# Patient Record
Sex: Female | Born: 1960 | Race: Black or African American | Hispanic: No | Marital: Single | Smoking: Current every day smoker
Health system: Southern US, Community
[De-identification: ages and names within clinical notes are randomized; demographics above are authoritative.]

## PROBLEM LIST (undated history)

## (undated) DIAGNOSIS — I69398 Other sequelae of cerebral infarction: Secondary | ICD-10-CM

## (undated) DIAGNOSIS — Z992 Dependence on renal dialysis: Secondary | ICD-10-CM

## (undated) DIAGNOSIS — R569 Unspecified convulsions: Secondary | ICD-10-CM

## (undated) DIAGNOSIS — N186 End stage renal disease: Secondary | ICD-10-CM

## (undated) DIAGNOSIS — R269 Unspecified abnormalities of gait and mobility: Secondary | ICD-10-CM

## (undated) DIAGNOSIS — I10 Essential (primary) hypertension: Secondary | ICD-10-CM

## (undated) DIAGNOSIS — I669 Occlusion and stenosis of unspecified cerebral artery: Secondary | ICD-10-CM

## (undated) DIAGNOSIS — Z794 Long term (current) use of insulin: Secondary | ICD-10-CM

## (undated) DIAGNOSIS — E119 Type 2 diabetes mellitus without complications: Secondary | ICD-10-CM

## (undated) DIAGNOSIS — Z8673 Personal history of transient ischemic attack (TIA), and cerebral infarction without residual deficits: Secondary | ICD-10-CM

## (undated) HISTORY — PX: AV FISTULA PLACEMENT: SHX1204

## (undated) HISTORY — PX: BURR HOLE: SHX908

---

## 2015-05-31 ENCOUNTER — Emergency Department (HOSPITAL_COMMUNITY): Payer: Medicaid Other

## 2015-05-31 ENCOUNTER — Non-Acute Institutional Stay (HOSPITAL_COMMUNITY)
Admission: EM | Admit: 2015-05-31 | Discharge: 2015-06-01 | Disposition: A | Discharge status: Home / Self Care | Payer: Medicaid Other | Attending: Emergency Medicine | Admitting: Emergency Medicine

## 2015-05-31 ENCOUNTER — Encounter (HOSPITAL_COMMUNITY): Payer: Self-pay | Admitting: *Deleted

## 2015-05-31 DIAGNOSIS — F17210 Nicotine dependence, cigarettes, uncomplicated: Secondary | ICD-10-CM | POA: Diagnosis not present

## 2015-05-31 DIAGNOSIS — F79 Unspecified intellectual disabilities: Secondary | ICD-10-CM | POA: Insufficient documentation

## 2015-05-31 DIAGNOSIS — N186 End stage renal disease: Secondary | ICD-10-CM | POA: Diagnosis present

## 2015-05-31 DIAGNOSIS — D631 Anemia in chronic kidney disease: Secondary | ICD-10-CM

## 2015-05-31 DIAGNOSIS — E875 Hyperkalemia: Secondary | ICD-10-CM | POA: Diagnosis not present

## 2015-05-31 DIAGNOSIS — E8779 Other fluid overload: Secondary | ICD-10-CM

## 2015-05-31 DIAGNOSIS — N189 Chronic kidney disease, unspecified: Secondary | ICD-10-CM

## 2015-05-31 DIAGNOSIS — Z8673 Personal history of transient ischemic attack (TIA), and cerebral infarction without residual deficits: Secondary | ICD-10-CM | POA: Insufficient documentation

## 2015-05-31 DIAGNOSIS — Z992 Dependence on renal dialysis: Secondary | ICD-10-CM | POA: Diagnosis not present

## 2015-05-31 HISTORY — DX: Unspecified convulsions: R56.9

## 2015-05-31 HISTORY — DX: Occlusion and stenosis of unspecified cerebral artery: I66.9

## 2015-05-31 LAB — BASIC METABOLIC PANEL
Anion gap: 25 — ABNORMAL HIGH (ref 5–15)
BUN: 112 mg/dL — ABNORMAL HIGH (ref 6–20)
CALCIUM: 8.5 mg/dL — AB (ref 8.9–10.3)
CO2: 18 mmol/L — ABNORMAL LOW (ref 22–32)
CREATININE: 17.54 mg/dL — AB (ref 0.44–1.00)
Chloride: 99 mmol/L — ABNORMAL LOW (ref 101–111)
GFR calc non Af Amer: 2 mL/min — ABNORMAL LOW (ref >60)
GFR, EST AFRICAN AMERICAN: 2 mL/min — AB (ref >60)
Glucose, Bld: 139 mg/dL — ABNORMAL HIGH (ref 65–99)
Potassium: 6.3 mmol/L (ref 3.5–5.1)
SODIUM: 142 mmol/L (ref 135–145)

## 2015-05-31 LAB — CBC WITH DIFFERENTIAL/PLATELET
BASOS PCT: 0 %
Basophils Absolute: 0 10*3/uL (ref 0.0–0.1)
Eosinophils Absolute: 0.2 10*3/uL (ref 0.0–0.7)
Eosinophils Relative: 4 %
HCT: 33.6 % — ABNORMAL LOW (ref 36.0–46.0)
HEMOGLOBIN: 10.8 g/dL — AB (ref 12.0–15.0)
Lymphocytes Relative: 32 %
Lymphs Abs: 1.8 10*3/uL (ref 0.7–4.0)
MCH: 27 pg (ref 26.0–34.0)
MCHC: 32.1 g/dL (ref 30.0–36.0)
MCV: 84 fL (ref 78.0–100.0)
MONOS PCT: 14 %
Monocytes Absolute: 0.8 10*3/uL (ref 0.1–1.0)
NEUTROS PCT: 50 %
Neutro Abs: 2.9 10*3/uL (ref 1.7–7.7)
PLATELETS: 146 10*3/uL — AB (ref 150–400)
RBC: 4 MIL/uL (ref 3.87–5.11)
RDW: 14.3 % (ref 11.5–15.5)
WBC: 5.7 10*3/uL (ref 4.0–10.5)

## 2015-05-31 MED ORDER — SODIUM POLYSTYRENE SULFONATE 15 GM/60ML PO SUSP
30.0000 g | Freq: Once | ORAL | Status: AC
  Administered 2015-05-31: 30 g via ORAL
  Filled 2015-05-31: qty 120

## 2015-05-31 NOTE — ED Notes (Signed)
CBC reordered for lab due to conflicting results

## 2015-05-31 NOTE — ED Notes (Signed)
Lab called with critical potassium 6.3
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
## 2015-06-01 DIAGNOSIS — E875 Hyperkalemia: Secondary | ICD-10-CM | POA: Diagnosis present

## 2015-06-01 NOTE — Progress Notes (Signed)
Dialysis treatment completed this a.m.[name] Pt tolerated HD well. Net UF removed: 2800 ml. Discharged (A/Ox3-4, coherent, and conversant) per wheelchair to sister 62.
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
## 2015-06-02 LAB — HEPATITIS B SURFACE ANTIGEN: HEP B S AG: NEGATIVE

## 2017-03-27 IMAGING — CR DG CHEST 1V PORT
1 series · 1 of 1 positions shown · non-contrast
Comparison: None.

CLINICAL DATA: 54-year-old with end-stage renal disease on
hemodialysis who has not been dialyzed for approximately 10 days,
presenting with acute shortness of breath.

EXAM:
PORTABLE CHEST 1 VIEW

[AP]
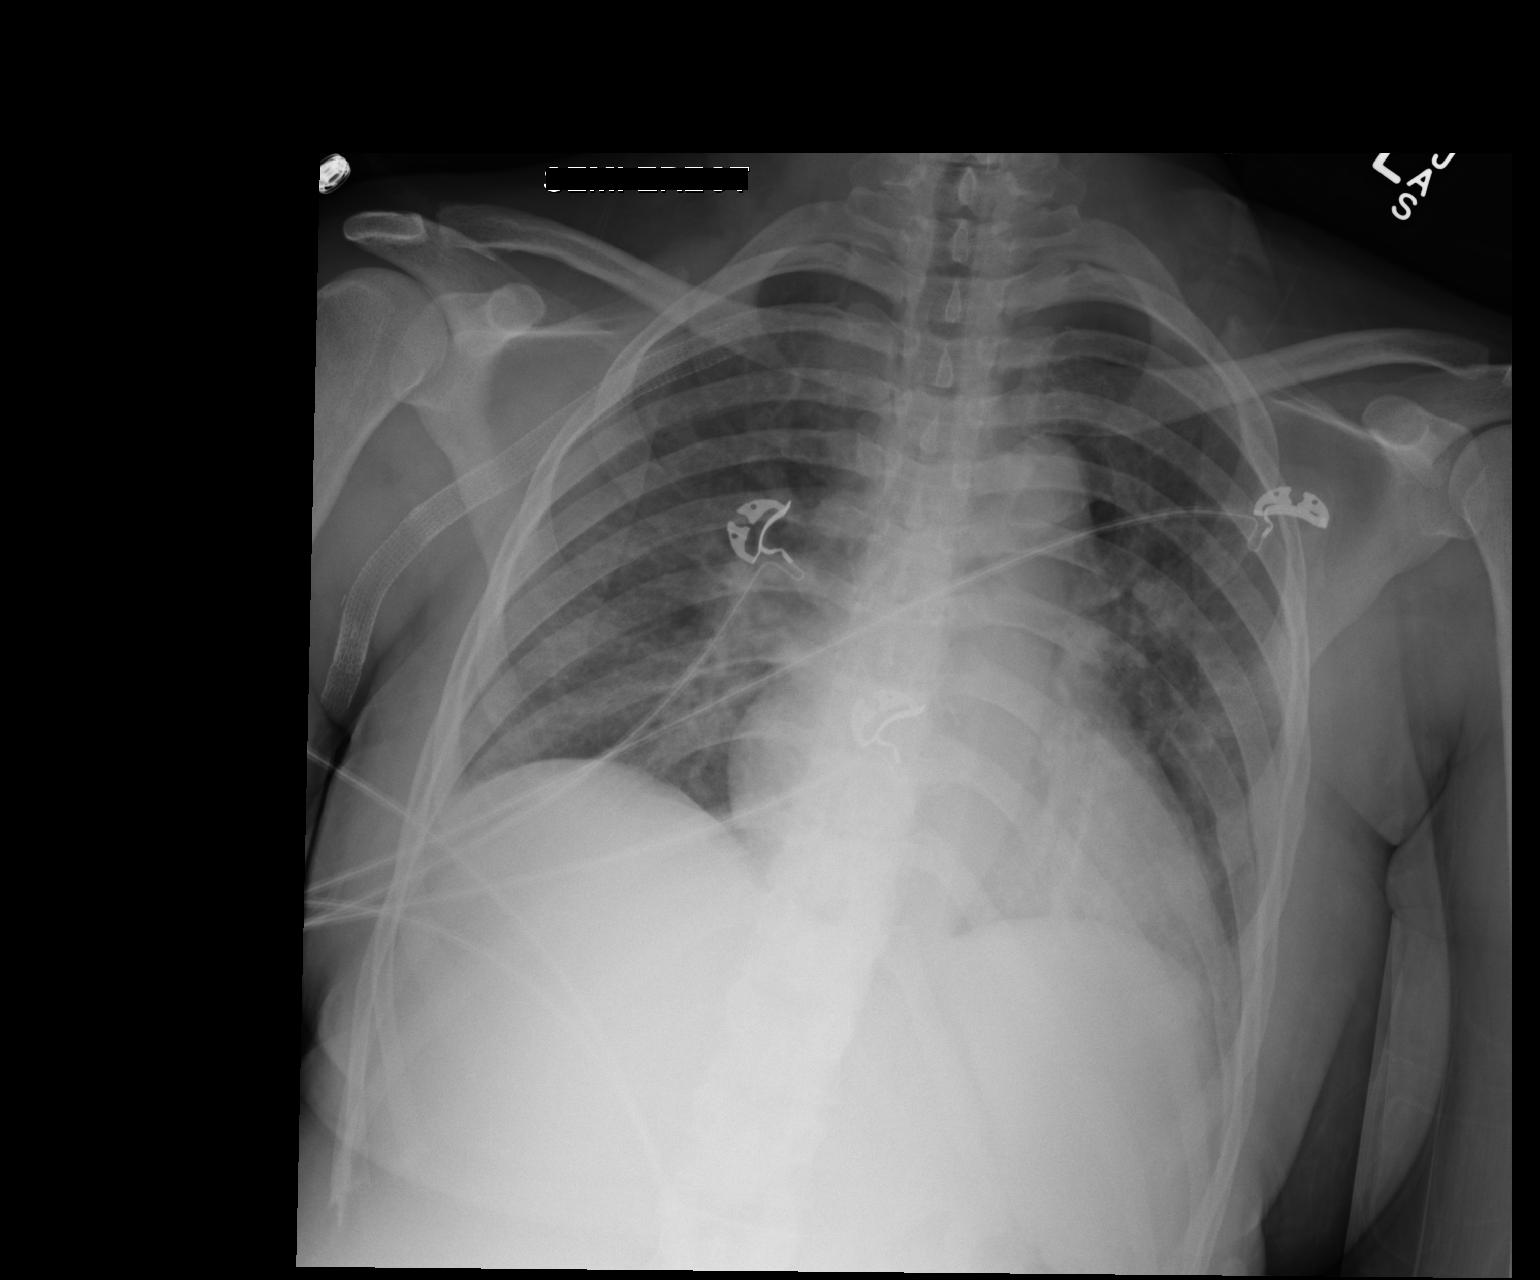

[1 of 1 positions shown; findings below may reference images not displayed]

FINDINGS: Cardiac silhouette mildly enlarged for AP portable technique.
Pulmonary venous hypertension and mild diffuse interstitial and
airspace pulmonary edema. Stent extending from the right break he
will or basilic vein into the right subclavian vein.
IMPRESSION: Mild CHF and/or fluid overload.

## 2017-04-28 ENCOUNTER — Other Ambulatory Visit: Payer: Self-pay | Admitting: Internal Medicine

## 2017-04-28 DIAGNOSIS — E2839 Other primary ovarian failure: Secondary | ICD-10-CM

## 2017-04-28 DIAGNOSIS — Z1231 Encounter for screening mammogram for malignant neoplasm of breast: Secondary | ICD-10-CM

## 2017-05-12 ENCOUNTER — Inpatient Hospital Stay (HOSPITAL_COMMUNITY): Payer: Medicaid Other

## 2017-05-12 ENCOUNTER — Emergency Department (HOSPITAL_COMMUNITY): Payer: Medicaid Other

## 2017-05-12 ENCOUNTER — Inpatient Hospital Stay (HOSPITAL_COMMUNITY)
Admission: EM | Admit: 2017-05-12 | Discharge: 2017-05-18 | DRG: 100 | Disposition: A | Discharge status: Skilled Nursing Facility | Payer: Medicaid Other | Attending: Internal Medicine | Admitting: Internal Medicine

## 2017-05-12 ENCOUNTER — Other Ambulatory Visit: Payer: Self-pay

## 2017-05-12 ENCOUNTER — Encounter (HOSPITAL_COMMUNITY): Payer: Self-pay

## 2017-05-12 DIAGNOSIS — F17210 Nicotine dependence, cigarettes, uncomplicated: Secondary | ICD-10-CM | POA: Diagnosis present

## 2017-05-12 DIAGNOSIS — R740 Nonspecific elevation of levels of transaminase and lactic acid dehydrogenase [LDH]: Secondary | ICD-10-CM

## 2017-05-12 DIAGNOSIS — Z794 Long term (current) use of insulin: Secondary | ICD-10-CM | POA: Diagnosis not present

## 2017-05-12 DIAGNOSIS — D631 Anemia in chronic kidney disease: Secondary | ICD-10-CM | POA: Diagnosis present

## 2017-05-12 DIAGNOSIS — E8889 Other specified metabolic disorders: Secondary | ICD-10-CM | POA: Diagnosis present

## 2017-05-12 DIAGNOSIS — J690 Pneumonitis due to inhalation of food and vomit: Secondary | ICD-10-CM | POA: Diagnosis present

## 2017-05-12 DIAGNOSIS — N2581 Secondary hyperparathyroidism of renal origin: Secondary | ICD-10-CM | POA: Diagnosis present

## 2017-05-12 DIAGNOSIS — E11649 Type 2 diabetes mellitus with hypoglycemia without coma: Secondary | ICD-10-CM

## 2017-05-12 DIAGNOSIS — G40201 Localization-related (focal) (partial) symptomatic epilepsy and epileptic syndromes with complex partial seizures, not intractable, with status epilepticus: Secondary | ICD-10-CM | POA: Diagnosis present

## 2017-05-12 DIAGNOSIS — R269 Unspecified abnormalities of gait and mobility: Secondary | ICD-10-CM | POA: Diagnosis not present

## 2017-05-12 DIAGNOSIS — Z8673 Personal history of transient ischemic attack (TIA), and cerebral infarction without residual deficits: Secondary | ICD-10-CM

## 2017-05-12 DIAGNOSIS — R739 Hyperglycemia, unspecified: Secondary | ICD-10-CM | POA: Insufficient documentation

## 2017-05-12 DIAGNOSIS — I120 Hypertensive chronic kidney disease with stage 5 chronic kidney disease or end stage renal disease: Secondary | ICD-10-CM | POA: Diagnosis present

## 2017-05-12 DIAGNOSIS — E1122 Type 2 diabetes mellitus with diabetic chronic kidney disease: Secondary | ICD-10-CM | POA: Diagnosis present

## 2017-05-12 DIAGNOSIS — T424X5A Adverse effect of benzodiazepines, initial encounter: Secondary | ICD-10-CM | POA: Diagnosis not present

## 2017-05-12 DIAGNOSIS — A419 Sepsis, unspecified organism: Secondary | ICD-10-CM | POA: Diagnosis not present

## 2017-05-12 DIAGNOSIS — E119 Type 2 diabetes mellitus without complications: Secondary | ICD-10-CM | POA: Diagnosis not present

## 2017-05-12 DIAGNOSIS — G40901 Epilepsy, unspecified, not intractable, with status epilepticus: Secondary | ICD-10-CM | POA: Diagnosis not present

## 2017-05-12 DIAGNOSIS — Z79899 Other long term (current) drug therapy: Secondary | ICD-10-CM

## 2017-05-12 DIAGNOSIS — D696 Thrombocytopenia, unspecified: Secondary | ICD-10-CM | POA: Diagnosis not present

## 2017-05-12 DIAGNOSIS — Y92239 Unspecified place in hospital as the place of occurrence of the external cause: Secondary | ICD-10-CM | POA: Diagnosis not present

## 2017-05-12 DIAGNOSIS — I69398 Other sequelae of cerebral infarction: Secondary | ICD-10-CM

## 2017-05-12 DIAGNOSIS — J9601 Acute respiratory failure with hypoxia: Secondary | ICD-10-CM | POA: Diagnosis present

## 2017-05-12 DIAGNOSIS — Z992 Dependence on renal dialysis: Secondary | ICD-10-CM | POA: Diagnosis not present

## 2017-05-12 DIAGNOSIS — N186 End stage renal disease: Secondary | ICD-10-CM | POA: Diagnosis not present

## 2017-05-12 DIAGNOSIS — G9340 Encephalopathy, unspecified: Secondary | ICD-10-CM | POA: Diagnosis not present

## 2017-05-12 DIAGNOSIS — G92 Toxic encephalopathy: Secondary | ICD-10-CM | POA: Diagnosis not present

## 2017-05-12 DIAGNOSIS — Z66 Do not resuscitate: Secondary | ICD-10-CM | POA: Diagnosis present

## 2017-05-12 DIAGNOSIS — R000 Tachycardia, unspecified: Secondary | ICD-10-CM | POA: Diagnosis present

## 2017-05-12 DIAGNOSIS — D649 Anemia, unspecified: Secondary | ICD-10-CM | POA: Diagnosis present

## 2017-05-12 DIAGNOSIS — I10 Essential (primary) hypertension: Secondary | ICD-10-CM | POA: Diagnosis present

## 2017-05-12 DIAGNOSIS — R7401 Elevation of levels of liver transaminase levels: Secondary | ICD-10-CM | POA: Diagnosis present

## 2017-05-12 HISTORY — DX: Unspecified abnormalities of gait and mobility: R26.9

## 2017-05-12 HISTORY — DX: End stage renal disease: N18.6

## 2017-05-12 HISTORY — DX: Long term (current) use of insulin: Z79.4

## 2017-05-12 HISTORY — DX: Essential (primary) hypertension: I10

## 2017-05-12 HISTORY — DX: Type 2 diabetes mellitus without complications: E11.9

## 2017-05-12 HISTORY — DX: Other sequelae of cerebral infarction: I69.398

## 2017-05-12 HISTORY — DX: Personal history of transient ischemic attack (TIA), and cerebral infarction without residual deficits: Z86.73

## 2017-05-12 HISTORY — DX: Dependence on renal dialysis: Z99.2

## 2017-05-12 LAB — CBC WITH DIFFERENTIAL/PLATELET
BASOS ABS: 0 10*3/uL (ref 0.0–0.1)
Basophils Relative: 0 %
EOS PCT: 3 %
Eosinophils Absolute: 0.2 10*3/uL (ref 0.0–0.7)
HEMATOCRIT: 37.1 % (ref 36.0–46.0)
Hemoglobin: 12.1 g/dL (ref 12.0–15.0)
Lymphocytes Relative: 20 %
Lymphs Abs: 1.2 10*3/uL (ref 0.7–4.0)
MCH: 27.9 pg (ref 26.0–34.0)
MCHC: 32.6 g/dL (ref 30.0–36.0)
MCV: 85.5 fL (ref 78.0–100.0)
MONOS PCT: 6 %
Monocytes Absolute: 0.4 10*3/uL (ref 0.1–1.0)
Neutro Abs: 4.1 10*3/uL (ref 1.7–7.7)
Neutrophils Relative %: 71 %
PLATELETS: 129 10*3/uL — AB (ref 150–400)
RBC: 4.34 MIL/uL (ref 3.87–5.11)
RDW: 14.9 % (ref 11.5–15.5)
WBC: 5.9 10*3/uL (ref 4.0–10.5)

## 2017-05-12 LAB — PHENYTOIN LEVEL, TOTAL: Phenytoin Lvl: <2.5 ug/mL — ABNORMAL LOW (ref 10.0–20.0)

## 2017-05-12 LAB — I-STAT ARTERIAL BLOOD GAS, ED
Acid-Base Excess: 9 mmol/L — ABNORMAL HIGH (ref 0.0–2.0)
Bicarbonate: 32.8 mmol/L — ABNORMAL HIGH (ref 20.0–28.0)
O2 SAT: 91 %
PCO2 ART: 46.9 mmHg (ref 32.0–48.0)
PH ART: 7.461 — AB (ref 7.350–7.450)
PO2 ART: 66 mmHg — AB (ref 83.0–108.0)
Patient temperature: 102.6
TCO2: 34 mmol/L — ABNORMAL HIGH (ref 22–32)

## 2017-05-12 LAB — COMPREHENSIVE METABOLIC PANEL
ALT: 60 U/L — ABNORMAL HIGH (ref 14–54)
ANION GAP: 17 — AB (ref 5–15)
AST: 68 U/L — AB (ref 15–41)
Albumin: 3.4 g/dL — ABNORMAL LOW (ref 3.5–5.0)
Alkaline Phosphatase: 128 U/L — ABNORMAL HIGH (ref 38–126)
BUN: 41 mg/dL — ABNORMAL HIGH (ref 6–20)
CHLORIDE: 95 mmol/L — AB (ref 101–111)
CO2: 29 mmol/L (ref 22–32)
Calcium: 9.8 mg/dL (ref 8.9–10.3)
Creatinine, Ser: 6.48 mg/dL — ABNORMAL HIGH (ref 0.44–1.00)
GFR, EST AFRICAN AMERICAN: 7 mL/min — AB (ref >60)
GFR, EST NON AFRICAN AMERICAN: 6 mL/min — AB (ref >60)
Glucose, Bld: 160 mg/dL — ABNORMAL HIGH (ref 65–99)
POTASSIUM: 4.4 mmol/L (ref 3.5–5.1)
Sodium: 141 mmol/L (ref 135–145)
Total Bilirubin: 0.7 mg/dL (ref 0.3–1.2)
Total Protein: 7.1 g/dL (ref 6.5–8.1)

## 2017-05-12 LAB — HEMOGLOBIN A1C
Hgb A1c MFr Bld: 10.4 % — ABNORMAL HIGH (ref 4.8–5.6)
MEAN PLASMA GLUCOSE: 251.78 mg/dL

## 2017-05-12 LAB — TSH: TSH: 1.695 u[IU]/mL (ref 0.350–4.500)

## 2017-05-12 LAB — I-STAT CHEM 8, ED
BUN: 38 mg/dL — ABNORMAL HIGH (ref 6–20)
CHLORIDE: 96 mmol/L — AB (ref 101–111)
CREATININE: 6.4 mg/dL — AB (ref 0.44–1.00)
Calcium, Ion: 1.03 mmol/L — ABNORMAL LOW (ref 1.15–1.40)
GLUCOSE: 157 mg/dL — AB (ref 65–99)
HEMATOCRIT: 38 % (ref 36.0–46.0)
HEMOGLOBIN: 12.9 g/dL (ref 12.0–15.0)
POTASSIUM: 4.2 mmol/L (ref 3.5–5.1)
Sodium: 138 mmol/L (ref 135–145)
TCO2: 33 mmol/L — ABNORMAL HIGH (ref 22–32)

## 2017-05-12 LAB — ETHANOL

## 2017-05-12 LAB — CBG MONITORING, ED
GLUCOSE-CAPILLARY: 96 mg/dL (ref 65–99)
Glucose-Capillary: 181 mg/dL — ABNORMAL HIGH (ref 65–99)

## 2017-05-12 LAB — MRSA PCR SCREENING: MRSA by PCR: NEGATIVE

## 2017-05-12 MED ORDER — LORAZEPAM 2 MG/ML IJ SOLN
2.0000 mg | Freq: Once | INTRAMUSCULAR | Status: DC
  Filled 2017-05-12: qty 1

## 2017-05-12 MED ORDER — ACETAMINOPHEN 650 MG RE SUPP: 650.0000 mg | Freq: Four times a day (QID) | RECTAL | Status: DC | PRN

## 2017-05-12 MED ORDER — LORAZEPAM 2 MG/ML IJ SOLN
2.0000 mg | Freq: Once | INTRAMUSCULAR | Status: AC
  Administered 2017-05-12: 2 mg via INTRAVENOUS
  Filled 2017-05-12: qty 1

## 2017-05-12 MED ORDER — SODIUM CHLORIDE 0.9 % IV SOLN
1000.0000 mg | INTRAVENOUS | Status: AC
  Administered 2017-05-12: 1000 mg via INTRAVENOUS
  Filled 2017-05-12: qty 10

## 2017-05-12 MED ORDER — SODIUM CHLORIDE 0.9 % IV SOLN
1000.0000 mg | Freq: Every day | INTRAVENOUS | Status: DC
  Administered 2017-05-13 – 2017-05-15 (×3): 1000 mg via INTRAVENOUS
  Filled 2017-05-12 (×4): qty 10

## 2017-05-12 MED ORDER — PIPERACILLIN-TAZOBACTAM 3.375 G IVPB 30 MIN
3.3750 g | Freq: Once | INTRAVENOUS | Status: AC
  Administered 2017-05-12: 3.375 g via INTRAVENOUS
  Filled 2017-05-12: qty 50

## 2017-05-12 MED ORDER — ACETAMINOPHEN 325 MG PO TABS: 650.0000 mg | ORAL_TABLET | Freq: Four times a day (QID) | ORAL | Status: DC | PRN

## 2017-05-12 MED ORDER — HEPARIN SODIUM (PORCINE) 5000 UNIT/ML IJ SOLN
5000.0000 [IU] | Freq: Three times a day (TID) | INTRAMUSCULAR | Status: DC
  Administered 2017-05-12 – 2017-05-18 (×18): 5000 [IU] via SUBCUTANEOUS
  Filled 2017-05-12 (×18): qty 1

## 2017-05-12 MED ORDER — ONDANSETRON HCL 4 MG/2ML IJ SOLN: 4.0000 mg | Freq: Four times a day (QID) | INTRAMUSCULAR | Status: DC | PRN

## 2017-05-12 MED ORDER — INSULIN ASPART 100 UNIT/ML ~~LOC~~ SOLN
0.0000 [IU] | SUBCUTANEOUS | Status: DC
  Administered 2017-05-12 – 2017-05-13 (×2): 2 [IU] via SUBCUTANEOUS
  Administered 2017-05-14 – 2017-05-15 (×2): 1 [IU] via SUBCUTANEOUS

## 2017-05-12 MED ORDER — ONDANSETRON HCL 4 MG PO TABS: 4.0000 mg | ORAL_TABLET | Freq: Four times a day (QID) | ORAL | Status: DC | PRN

## 2017-05-12 MED ORDER — VANCOMYCIN HCL IN DEXTROSE 1-5 GM/200ML-% IV SOLN
1000.0000 mg | Freq: Once | INTRAVENOUS | Status: AC
  Administered 2017-05-12: 1000 mg via INTRAVENOUS
  Filled 2017-05-12: qty 200

## 2017-05-12 MED ORDER — SODIUM CHLORIDE 0.9 % IV SOLN
1000.0000 mg | Freq: Once | INTRAVENOUS | Status: AC
  Administered 2017-05-12: 1000 mg via INTRAVENOUS
  Filled 2017-05-12: qty 10

## 2017-05-12 MED ORDER — PIPERACILLIN-TAZOBACTAM 3.375 G IVPB
3.3750 g | Freq: Two times a day (BID) | INTRAVENOUS | Status: DC
  Administered 2017-05-13 – 2017-05-14 (×3): 3.375 g via INTRAVENOUS
  Filled 2017-05-12 (×4): qty 50

## 2017-05-12 MED ORDER — HYDRALAZINE HCL 20 MG/ML IJ SOLN: 10.0000 mg | Freq: Four times a day (QID) | INTRAMUSCULAR | Status: DC | PRN

## 2017-05-12 MED ORDER — PHENYTOIN SODIUM 50 MG/ML IJ SOLN
100.0000 mg | Freq: Three times a day (TID) | INTRAMUSCULAR | Status: DC
  Administered 2017-05-13 – 2017-05-14 (×5): 100 mg via INTRAVENOUS
  Filled 2017-05-12 (×6): qty 2

## 2017-05-12 MED ORDER — LORAZEPAM 2 MG/ML IJ SOLN
1.0000 mg | INTRAMUSCULAR | Status: AC
  Administered 2017-05-12: 1 mg via INTRAVENOUS

## 2017-05-12 MED ORDER — SODIUM CHLORIDE 0.9% FLUSH
3.0000 mL | Freq: Two times a day (BID) | INTRAVENOUS | Status: DC
  Administered 2017-05-13 – 2017-05-18 (×9): 3 mL via INTRAVENOUS

## 2017-05-12 MED ORDER — SODIUM CHLORIDE 0.9 % IV SOLN
1500.0000 mg | INTRAVENOUS | Status: AC
  Administered 2017-05-12: 1500 mg via INTRAVENOUS
  Filled 2017-05-12: qty 30

## 2017-05-12 MED ORDER — ACETAMINOPHEN 650 MG RE SUPP
650.0000 mg | Freq: Once | RECTAL | Status: AC
  Administered 2017-05-12: 650 mg via RECTAL
  Filled 2017-05-12: qty 1

## 2017-05-12 NOTE — Procedures (Signed)
History: 57 year old female with a history of previous stroke and seizure not on antiepileptics who presents with focal status epilepticus  Sedation: Ativan, Keppra, Dilantin  Technique: This is a 21 channel routine scalp EEG performed at the bedside with bipolar and monopolar montages arranged in accordance to the international 10/20 system of electrode placement. One channel was dedicated to EKG recording.    Background: There are periodic epileptiform discharges maximal in the left temporal regions with some evidence of evolution in both field and frequency with associated faster activity superimposed on this.   The faster activity attenuates followed by the periodic activity with cessation of ictal activity at 15:43.  Following this, the background is relatively slow with diffuse irregular slow activity.  Photic stimulation: Physiologic driving is not performed  EEG Abnormalities: 1) periodic epileptiform discharges with superimposed fast activity with evidence of evolution 2) generalized irregular slow activity  Clinical Interpretation: This EEG is most consistent with focal status epilepticus with resolution with treatment.  Ritta SlotMcNeill Karl Erway, MD [facility] [phone]  If 7pm- 7am, please page neurology on call as listed in AMION.

## 2017-05-12 NOTE — ED Notes (Signed)
Bladder scan shows 16mL.

## 2017-05-12 NOTE — H&P (Signed)
History and Physical    Alexis Sims RUE:[id number] DOB: Sep 05, 1960 DOA: 05/12/2017  **Will admit patient based on the expectation that the patient will need hospitalization/ hospital care that crosses at least 2 midnights  PCP: Fleet Contras, MD   Attending physician: Benjamine Mola  Patient coming from/Resides with: Private residence/sister  Chief Complaint: Status epilepticus  HPI: Alexis Sims is a 57 y.o. female with medical history significant for remote CVA with associated chronic gait disturbance, chronic kidney disease on dialysis times 10 years, diabetes mellitus recent start on Lantus, hypertension recent start amlodipine and chronic thrombocytopenia.  Patient was brought to the ER after having 2 witnessed seizures on a local bus while she was traveling to go to dialysis today.  She had an additional seizure while being transported on the ambulance by EMS.  It is documented in the triage notes that the paramedics noted that when the patient attempted to walk towards the ambulance after her first seizure she was dragging her right leg.  She was noted to have difficulty utilizing the right arm and leg with apparent drift noted in triage.  After arrival to the ER and in the presence of the neurology team patient had another witnessed seizure.  She has been given loading doses of Keppra and fosphenytoin as well as multiple doses of Ativan in the ER.  Neurology has also ordered Dilantin 100 mg every 8 hours.  Because she is currently not actively seizing the neurology team recommended internal medicine admission.  She is currently maintaining her airway, post ictal and not hypoxemic.  History has been obtained by the patient's sister at the bedside.  ED Course:  Vital Signs: BP (!) 211/93   Pulse (!) 145   Temp 98.3 F (36.8 C) (Oral)   Resp (!) 21   SpO2 99%  Lab data: Sodium 141, potassium 4.4, chloride 95, CO2 29, glucose 160, BUN 41, creatinine 6.48, calcium 9.8, anion gap 17, alkaline  phosphatase 128, albumin 3.4, AST 68, ALT 60, white count 5900 normal differential, hemoglobin 12.1, platelets 129,000, ETOH <10 Medications and treatments: Keppra 1 g IV x1, Ativan 2 mg IV x1, fosphenytoin 1500 mg IV x1, Ativan 1 mg IV to 1, Keppra 1 g IV x1 additional dose  Review of Systems:  **Patient unable to provide history given current postictal state History has been obtained from the patient's sister.  She reports patient takes her medications as prescribed, patient does not use illegal drugs or drink alcoholic beverages, patient does make urine but does not have recurrent urinary tract infections and has not reported any infectious symptoms such as fevers, chills, myalgias, nausea, vomiting or diarrhea.   Past Medical History:  Diagnosis Date  . Blood clots in brain   . CKD (chronic kidney disease) stage V requiring chronic dialysis (HCC)   . Diabetes mellitus type 2, insulin dependent (HCC)   . Gait disturbance, post-stroke   . History of CVA (cerebrovascular accident)   . HTN (hypertension)   . Seizures (HCC)     Past Surgical History:  Procedure Laterality Date  . AV FISTULA PLACEMENT    . BURR HOLE      Social History   Socioeconomic History  . Marital status: Single    Spouse name: Not on file  . Number of children: Not on file  . Years of education: Not on file  . Highest education level: Not on file  Social Needs  . Financial resource strain: Not on file  . Food insecurity -  worry: Not on file  . Food insecurity - inability: Not on file  . Transportation needs - medical: Not on file  . Transportation needs - non-medical: Not on file  Occupational History  . Not on file  Tobacco Use  . Smoking status: Current Every Day Smoker    Packs/day: 0.50    Types: Cigarettes, Pipe  . Smokeless tobacco: Never Used  Substance and Sexual Activity  . Alcohol use: No  . Drug use: No  . Sexual activity: Not on file  Other Topics Concern  . Not on file  Social  History Narrative  . Not on file    Mobility: Rolling walker Work history: Disabled   No Known Allergies  Family history reviewed and not pertinent to current admission findings and diagnosis  Prior to Admission medications   Medication Sig Start Date End Date Taking? Authorizing Provider  VELPHORO 500 MG chewable tablet Chew 1,000 mg by mouth 3 (three) times daily. 03/18/15   [provider]    Physical Exam: Vitals:   05/12/17 1345 05/12/17 1400 05/12/17 1500 05/12/17 1630  BP: (!) 206/92 (!) 205/90 (!) 211/93   Pulse: (!) 126 (!) 126 (!) 137 (!) 145  Resp: (!) 35 (!) 33 (!) 25 (!) 21  Temp:      TempSrc:      SpO2: 94% 94% 100% 99%      Constitutional: Postictal and easily agitated noting EDP placing left upper extremity IV with assistance of ultrasound machine; patient resistant to painful stimuli and requires multiple staff to restrain to achieve placement of IV Eyes: Left pupil pinpoint and sluggish to react, patient has chronic injection and irregular nonreactive pupil secondary to prior cataract surgery, lids with exophthalmic appearance, conjunctivae normal ENMT: Mucous membranes are dry. Posterior pharynx clear of any exudate or lesions. Poor dentition.  Neck: normal, supple, no masses, no thyromegaly Respiratory: Coarse to auscultation bilaterally with expiratory rhonchi.  Tachypneic without increased work of breathing, 2 L oxygen Cardiovascular: Regular rhythm with tachycardic rate up into the 130s, no murmurs / rubs / gallops. No extremity edema. 2+ pedal pulses. No carotid bruits.  Abdomen: no tenderness, no masses palpated. No hepatosplenomegaly. Bowel sounds positive.  Musculoskeletal: no clubbing / cyanosis. No joint deformity upper and lower extremities. Good ROM, no contractures. Normal muscle tone.  Skin: no rashes, lesions, ulcers. No induration Neurologic: CN 2-12 grossly intact. Sensation intact, DTR normal. Strength 5/5 x all 4 extremities.    Psychiatric: Post ictal and easily agitated; because of current altered mentation patient unable to answer orientation or mood questions appropriately.   Labs on Admission: I have personally reviewed following labs and imaging studies  CBC: Recent Labs  Lab 05/12/17 1343 05/12/17 1351  WBC 5.9  --   NEUTROABS 4.1  --   HGB 12.1 12.9  HCT 37.1 38.0  MCV 85.5  --   PLT 129*  --    Basic Metabolic Panel: Recent Labs  Lab 05/12/17 1343 05/12/17 1351  NA 141 138  K 4.4 4.2  CL 95* 96*  CO2 29  --   GLUCOSE 160* 157*  BUN 41* 38*  CREATININE 6.48* 6.40*  CALCIUM 9.8  --    GFR: CrCl cannot be calculated (Unknown ideal weight.). Liver Function Tests: Recent Labs  Lab 05/12/17 1343  AST 68*  ALT 60*  ALKPHOS 128*  BILITOT 0.7  PROT 7.1  ALBUMIN 3.4*   No results for input(s): LIPASE, AMYLASE in the last 168 hours. No  results for input(s): AMMONIA in the last 168 hours. Coagulation Profile: No results for input(s): INR, PROTIME in the last 168 hours. Cardiac Enzymes: No results for input(s): CKTOTAL, CKMB, CKMBINDEX, TROPONINI in the last 168 hours. BNP (last 3 results) No results for input(s): PROBNP in the last 8760 hours. HbA1C: No results for input(s): HGBA1C in the last 72 hours. CBG: No results for input(s): GLUCAP in the last 168 hours. Lipid Profile: No results for input(s): CHOL, HDL, LDLCALC, TRIG, CHOLHDL, LDLDIRECT in the last 72 hours. Thyroid Function Tests: No results for input(s): TSH, T4TOTAL, FREET4, T3FREE, THYROIDAB in the last 72 hours. Anemia Panel: No results for input(s): VITAMINB12, FOLATE, FERRITIN, TIBC, IRON, RETICCTPCT in the last 72 hours. Urine analysis: No results found for: COLORURINE, APPEARANCEUR, LABSPEC, PHURINE, GLUCOSEU, HGBUR, BILIRUBINUR, KETONESUR, PROTEINUR, UROBILINOGEN, NITRITE, LEUKOCYTESUR Sepsis Labs: @LABRCNTIP (procalcitonin:4,lacticidven:4) )No results found for this or any previous visit (from the past 240  hour(s)).   Radiological Exams on Admission: Ct Head Wo Contrast  Result Date: 05/12/2017 CLINICAL DATA:  Seizure.  Hypertension EXAM: CT HEAD WITHOUT CONTRAST TECHNIQUE: Contiguous axial images were obtained from the base of the skull through the vertex without intravenous contrast. COMPARISON:  None. FINDINGS: Brain: Encephalomalacia in the anterior left frontal lobe underlying a calvarial defect, possibly burr hole or craniectomy defect. Mild cerebral atrophy. Ventriculomegaly likely related to ex vacuo dilatation. No hemorrhage. There is an area of low-density in the left temporal lobe concerning for infarct, age indeterminate. Vascular: No hyperdense vessel or unexpected calcification. Skull: No acute calvarial abnormality. Sinuses/Orbits: Visualized paranasal sinuses and mastoids clear. Orbital soft tissues unremarkable. Other: None IMPRESSION: Area of encephalomalacia within the anterior left frontal lobe underlying calvarial defects, likely burr hole. Area of low-density in the left temporal lobe, age indeterminate infarct. Atrophy, associated ventriculomegaly. Electronically Signed   By: Charlett Nose M.D.   On: 05/12/2017 12:53    EKG: (Independently reviewed) sinus tachycardia with ventricular rate 129 bpm, QTC 467 ms, normal R wave rotation, no definitive ischemic changes appreciated.  Assessment/Plan Principal Problem:   Status epilepticus  -Patient presents to ER after multiple witnessed seizures consistent with status epilepticus-currently postictal without any active tonic-clonic activity although remains tachycardic -Appreciate neurology assistance -No documentation of hypoxemia although 2 L nasal cannula oxygen applied during seizure activity -EEG in process -Has been loaded with fosphenytoin and Keppra by neurology; Dilantin also ordered-management of AEDs per neurology team -Seizure precautions-currently post ictal -Neurological checks every 2 hours -Patient with persistent  tachycardia (see below) as well as abnormal lung sounds concerning for possible aspiration event during multiple seizures-portable chest x-ray has been completed and not yet read by radiology but based on my review patient has bilateral changes concerning for pneumonitis although asymmetric edema from volume overload from missed dialysis not excluded -Begin empiric Zosyn IV for possible aspiration pneumonitis -Unsure precipitating event-no fever or leukocytosis-?  Viral syndrome-check respiratory viral panel-currently no issues with coughing or sneezing earlier today  **Given concerns over patient's ability to protect her airway I have consulted PCCM to evaluate patient  Active Problems:   Transaminitis -No prior labs for comparison -Total bilirubin normal -Follow labs    Sinus tachycardia -Likely reflective of recurrent seizure activity -Tachycardia is persisting despite no evidence of active tonic-clonic seizure activity and postictal state -Does have mild exophthalmic changes to eyes so we will check TSH    CKD (chronic kidney disease) stage V requiring chronic dialysis  -Nephrology notified patient admitted missed dialysis today (MWF) -If it is  felt the patient's x-ray is more consistent with pulmonary edema she will need dialysis tonight urgently    HTN (hypertension) -Recently started on Norvasc 5 mg daily -Hydralazine 10 mg IV prn    Diabetes mellitus type 2, insulin dependent  -Lantus 16 units at at bedtime started 2 weeks ago -For now follow CBGs every 4 hours and provide SSI    History of CVA (cerebrovascular accident)/Gait disturbance, post-stroke -Mobilizes with rolling walker -Walks to the bus stop to catch best to go to dialysis treatment sessions    Thrombocytopenia  -Platelets 129,000 with previous reading 2 years ago 146,000      DVT prophylaxis: SQ heparin Code Status: Full Family Communication: Sister who is POA Disposition Plan: Home Consults called:  Nephrology/[place]hertz; Neurology/Kirkpatrick, PCCM/Miller    ELLIS,ALLISON L. ANP-BC [facility] Pager [phone]   If 7PM-7AM, please contact night-coverage [email or web address] Password [email or web address]  05/12/2017, 5:12 PM

## 2017-05-12 NOTE — ED Provider Notes (Signed)
4:40 PM  EMERGENCY DEPARTMENT  US GUIDANCE EXAM Emergency Ultrasound:  US Guidance for Needle Guidance  INDICATIONS: Difficult vascular access Linear probe used in real-time to visualize location of needle entry through skin.   PERFORMED BY: Myself IMAGES ARCHIVED?: Yes LIMITATIONS: pt seizing VIEWS USED: Transverse INTERPRETATION: Needle visualized within vein, Left arm and Needle gauge 18     Tegeler, Canary Brimhristopher J, MD 05/12/17 [phone]

## 2017-05-12 NOTE — Consult Note (Addendum)
NEURO HOSPITALIST CONSULT NOTE   Requestig physician: Dr. Fredderick Phenix   Reason for Consult: Status epilepticus   History obtained from: Patient's friend at bedside and chart  HPI:                                                                                                                                          Alexis Sims is an 57 y.o. female with known seizures in the past in which she required intubation and burst suppression.  At this point patient is not on any antiepileptic medications.  Today patient was going to dialysis when she had a sudden onset of tonic-clonic seizure.  EMS called and on the way to the truck patient had a second seizure.  She was given 5 of Versed.  Upon entering the emergency department she seemed to be coming around however had a right Todd's paralysis.  Upon entering the room for consultation patient was currently having a seizure with rhythmic twitching of the right arm and leg.  Per friend who is at bedside she had been having multiple seizures every 6 minutes or so after receiving 1 g of Keppra.  Patient received 3 mg of Ativan which seemed to slow down the seizure but did not resolve the seizure completely.  At that time 1500 mg of fosphenytoin and 1 g of Keppra was ordered.  In addition, EEG was called and currently she is being placed on the EEG monitor.  As stated with her previous seizure it required a 7-day stay, intubation, and burst suppression to stop the seizures.  Past Medical History:  Diagnosis Date  . Blood clots in brain   . Seizures (HCC)   . Stroke [facility])     Past Surgical History:  Procedure Laterality Date  . BURR HOLE      No family history on file.    Social History:  reports that she has been smoking cigarettes and pipe.  She has been smoking about 0.50 packs per day. she has never used smokeless tobacco. She reports that she does not drink alcohol or use drugs.  No Known Allergies  MEDICATIONS:  Current Facility-Administered Medications  Medication Dose Route Frequency Provider Last Rate Last Dose  . fosPHENYtoin (CEREBYX) 1,500 mg PE in sodium chloride 0.9 % 50 mL IVPB  1,500 mg PE Intravenous STAT Ulice DashSmith, David R, PA-C      . levETIRAcetam (KEPPRA) 1,000 mg in sodium chloride 0.9 % 100 mL IVPB  1,000 mg Intravenous STAT Ulice DashSmith, David R, PA-C      . LORazepam (ATIVAN) injection 1 mg  1 mg Intravenous STAT Ulice DashSmith, David R, PA-C      . LORazepam (ATIVAN) injection 2 mg  2 mg Intravenous Once Rejeana BrockKirkpatrick, Karcyn Menn P, MD      . LORazepam (ATIVAN) injection 2 mg  2 mg Intravenous Once Ulice DashSmith, David R, PA-C       Current Outpatient Medications  Medication Sig Dispense Refill  . VELPHORO 500 MG chewable tablet Chew 1,000 mg by mouth 3 (three) times daily.  11      ROS:                                                                                                                                       History obtained from unobtainable from patient due to mental status     Blood pressure (!) 205/90, pulse (!) 126, temperature 98.3 F (36.8 C), temperature source Oral, resp. rate (!) 33, SpO2 94 %.   General Examination:                                                                                                       Physical Exam  HEENT-  Normocephalic, no lesions, without obvious abnormality.  Eyes are deviated to the left and at times come back to midline.   Cardiovascular- S1-S2 audible, pulses palpable throughout   Lungs-no rhonchi or wheezing noted, no excessive working breathing.  Saturations within normal limits Abdomen- All 4 quadrants palpated and nontender Extremities- Warm, dry and intact Musculoskeletal-no joint tenderness, deformity or swelling Skin-warm and dry, no hyperpigmentation, vitiligo, or suspicious lesions  Neurological Examination Mental  Status: Currently having complex partial seizure involving the right arm and leg, does not respond to verbal stimuli, at times spontaneously and maliciously will move her left leg and arm. Cranial Nerves: II: No blink to threat III,IV, VI: Patient is right pupil is irregular secondary to surgery, left pupil is 2 mm and minimally reactive, eyes are deviated to the left and as stated above at times will come back to midline but the majority of consultation had a  left gaze preference. V,VII: Face symmetric,   Motor: Right arm and leg with rhythmic twitching and increased tone, left arm and leg at times religiously and spontaneously moving antigravity.  With no increased tone. Sensory: No significant response to noxious stimuli on either side. Deep Tendon Reflexes: 2+ and symmetric throughout Plantars: Right: downgoing   Left: downgoing Cerebellar: Unable to perform finger to nose or heel to shin Gait: Not tested   Lab Results: Basic Metabolic Panel: Recent Labs  Lab 05/12/17 1343 05/12/17 1351  NA 141 138  K 4.4 4.2  CL 95* 96*  CO2 29  --   GLUCOSE 160* 157*  BUN 41* 38*  CREATININE 6.48* 6.40*  CALCIUM 9.8  --     CBC: Recent Labs  Lab 05/12/17 1343 05/12/17 1351  WBC 5.9  --   NEUTROABS PENDING  --   HGB 12.1 12.9  HCT 37.1 38.0  MCV 85.5  --   PLT PENDING  --     Cardiac Enzymes: No results for input(s): CKTOTAL, CKMB, CKMBINDEX, TROPONINI in the last 168 hours.  Lipid Panel: No results for input(s): CHOL, TRIG, HDL, CHOLHDL, VLDL, LDLCALC in the last 168 hours.  Imaging: Ct Head Wo Contrast  Result Date: 05/12/2017 CLINICAL DATA:  Seizure.  Hypertension EXAM: CT HEAD WITHOUT CONTRAST TECHNIQUE: Contiguous axial images were obtained from the base of the skull through the vertex without intravenous contrast. COMPARISON:  None. FINDINGS: Brain: Encephalomalacia in the anterior left frontal lobe underlying a calvarial defect, possibly burr hole or craniectomy  defect. Mild cerebral atrophy. Ventriculomegaly likely related to ex vacuo dilatation. No hemorrhage. There is an area of low-density in the left temporal lobe concerning for infarct, age indeterminate. Vascular: No hyperdense vessel or unexpected calcification. Skull: No acute calvarial abnormality. Sinuses/Orbits: Visualized paranasal sinuses and mastoids clear. Orbital soft tissues unremarkable. Other: None IMPRESSION: Area of encephalomalacia within the anterior left frontal lobe underlying calvarial defects, likely burr hole. Area of low-density in the left temporal lobe, age indeterminate infarct. Atrophy, associated ventriculomegaly. Electronically Signed   By: Charlett Nose M.D.   On: 05/12/2017 12:53    Assessment and plan per attending neurologist  Felicie Morn PA-C [facility] [phone]  05/12/2017, 3:18 PM   Assessment/Plan: 57 year old female currently in status epilepticus with complex partial seizure involving the right arm and leg.  She broke with Keppra and fosphenytoin.  She continues on EEG currently.  She has a history of previous stroke as well as previous seizure and I suspect her former stroke is the etiology for her seizure  1) continue Keppra 1 g daily 2) continue Dilantin 100 mg 3 times daily 3) MRI brain once able 4) neurology will continue to follow  Ritta Slot, MD [facility] [phone]  If 7pm- 7am, please page neurology on call as listed in [facility].

## 2017-05-12 NOTE — ED Provider Notes (Signed)
[facility] Provider Note   CSN: [id number] Arrival date & time: 05/12/17  1224     History   Chief Complaint Chief Complaint  Patient presents with  . Seizures    HPI Alexis Sims is a 57 y.o. female.  Patient is a 57 year old female with a history of end-stage renal disease on dialysis prior history of seizures who presents after a seizure.  Her sister who is with her states that she had history of seizures during childhood.  However then she did not have any seizures and was taken off medications.  She did have a seizure in 2015 and [facility]  She was not put on any seizure medications.  She was on the bus today on the way to dialysis and had a witnessed seizure.  EMS was called and she had a second seizure in route by EMS.  She was given 5 mg of Versed.  On arrival to the ED she was less responsive and not answering questions well.  She is noted to have some right arm and leg weakness.  History is limited due to her confusion.      Past Medical History:  Diagnosis Date  . Blood clots in brain   . Seizures (HCC)   . Stroke [facility](HCC)     Patient Active Problem List   Diagnosis Date Noted  . Hyperkalemia 06/01/2015    Past Surgical History:  Procedure Laterality Date  . BURR HOLE      OB History    No data available       Home Medications    Prior to Admission medications   Medication Sig Start Date End Date Taking? Authorizing Provider  VELPHORO 500 MG chewable tablet Chew 1,000 mg by mouth 3 (three) times daily. 03/18/15   [provider]    Family History No family history on file.  Social History Social History   Tobacco Use  . Smoking status: Current Every Day Smoker    Packs/day: 0.50    Types: Cigarettes, Pipe  . Smokeless tobacco: Never Used  Substance Use Topics  . Alcohol use: No  . Drug use: No     Allergies   Patient has no known allergies.   Review of  Systems Review of Systems  Unable to perform ROS: Mental status change     Physical Exam Updated Vital Signs BP (!) 205/90   Pulse (!) 126   Temp 98.3 F (36.8 C) (Oral)   Resp (!) 33   SpO2 94%   Physical Exam  Constitutional: She appears well-developed and well-nourished.  HENT:  Head: Normocephalic and atraumatic.  Eyes: Pupils are equal, round, and reactive to light.  Neck: Normal range of motion. Neck supple.  Cardiovascular: Normal rate, regular rhythm and normal heart sounds.  Pulmonary/Chest: Effort normal and breath sounds normal. No respiratory distress. She has no wheezes. She has no rales. She exhibits no tenderness.  Abdominal: Soft. Bowel sounds are normal. There is no tenderness. There is no rebound and no guarding.  Musculoskeletal: Normal range of motion. She exhibits no edema.  Lymphadenopathy:    She has no cervical adenopathy.  Neurological: She is alert.  Will not answer questions.  She has weakness in her right arm and left leg and will not lift them above gravity although she does not really follow commands well so her exam is difficult to do.  Skin: Skin is warm and dry. No rash noted.  Psychiatric:  She has a normal mood and affect.     ED Treatments / Results  Labs (all labs ordered are listed, but only abnormal results are displayed) Labs Reviewed  COMPREHENSIVE METABOLIC PANEL - Abnormal; Notable for the following components:      Result Value   Chloride 95 (*)    Glucose, Bld 160 (*)    BUN 41 (*)    Creatinine, Ser 6.48 (*)    Albumin 3.4 (*)    AST 68 (*)    ALT 60 (*)    Alkaline Phosphatase 128 (*)    GFR calc non Af Amer 6 (*)    GFR calc Af Amer 7 (*)    Anion gap 17 (*)    All other components within normal limits  CBC WITH DIFFERENTIAL/PLATELET - Abnormal; Notable for the following components:   Platelets 129 (*)    All other components within normal limits  I-STAT CHEM 8, ED - Abnormal; Notable for the following components:    Chloride 96 (*)    BUN 38 (*)    Creatinine, Ser 6.40 (*)    Glucose, Bld 157 (*)    Calcium, Ion 1.03 (*)    TCO2 33 (*)    All other components within normal limits  ETHANOL  RAPID URINE DRUG SCREEN, HOSP PERFORMED  PHENYTOIN LEVEL, TOTAL    EKG  EKG Interpretation  Date/Time:  Wednesday May 12 2017 12:33:07 EST Ventricular Rate:  129 PR Interval:    QRS Duration: 77 QT Interval:  321 QTC Calculation: 467 R Axis:   57 Text Interpretation:  Sinus tachycardia Ventricular tachycardia, unsustained Borderline repolarization abnormality SINCE LAST TRACING HEART RATE HAS INCREASED Confirmed by Rolan Bucco [phone]) on 05/12/2017 1:29:54 PM       Radiology Ct Head Wo Contrast  Result Date: 05/12/2017 CLINICAL DATA:  Seizure.  Hypertension EXAM: CT HEAD WITHOUT CONTRAST TECHNIQUE: Contiguous axial images were obtained from the base of the skull through the vertex without intravenous contrast. COMPARISON:  None. FINDINGS: Brain: Encephalomalacia in the anterior left frontal lobe underlying a calvarial defect, possibly burr hole or craniectomy defect. Mild cerebral atrophy. Ventriculomegaly likely related to ex vacuo dilatation. No hemorrhage. There is an area of low-density in the left temporal lobe concerning for infarct, age indeterminate. Vascular: No hyperdense vessel or unexpected calcification. Skull: No acute calvarial abnormality. Sinuses/Orbits: Visualized paranasal sinuses and mastoids clear. Orbital soft tissues unremarkable. Other: None IMPRESSION: Area of encephalomalacia within the anterior left frontal lobe underlying calvarial defects, likely burr hole. Area of low-density in the left temporal lobe, age indeterminate infarct. Atrophy, associated ventriculomegaly. Electronically Signed   By: Charlett Nose M.D.   On: 05/12/2017 12:53    Procedures Procedures (including critical care time)  Medications Ordered in ED Medications  LORazepam (ATIVAN) injection 2 mg (not  administered)  phenytoin (DILANTIN) injection 100 mg (not administered)  levETIRAcetam (KEPPRA) 1,000 mg in sodium chloride 0.9 % 100 mL IVPB (0 mg Intravenous Stopped 05/12/17 1453)  LORazepam (ATIVAN) injection 2 mg (2 mg Intravenous Given 05/12/17 1503)  fosPHENYtoin (CEREBYX) 1,500 mg PE in sodium chloride 0.9 % 50 mL IVPB (0 mg PE Intravenous Stopped 05/12/17 1539)  LORazepam (ATIVAN) injection 1 mg (1 mg Intravenous Given 05/12/17 1510)  levETIRAcetam (KEPPRA) 1,000 mg in sodium chloride 0.9 % 100 mL IVPB (0 mg Intravenous Stopped 05/12/17 1604)     Initial Impression / Assessment and Plan / ED Course  I have reviewed the triage vital signs and the nursing  notes.  Pertinent labs & imaging results that were available during my care of the patient were reviewed by me and considered in my medical decision making (see chart for details).     13:15 pt more alert, but still confused.  Weakness to right side improving  15:00 PT having more seizures, given ativan, neuro here to see.  Has already been loaded with keppra, neuro added fosphenytoin.  Patient does not appear to be currently having ongoing seizures.  She is very sedate given her multiple Ativan and antiepileptic medications that were given.  However she does respond to painful stimuli and does have an intact gag reflex.  She is maintaining normal oxygen saturations.  I spoke with Revonda Standard with the hospitalist service who will admit the patient.  CRITICAL CARE Performed by: Rolan Bucco Total critical care time: 60 minutes Critical care time was exclusive of separately billable procedures and treating other patients. Critical care was necessary to treat or prevent imminent or life-threatening deterioration. Critical care was time spent personally by me on the following activities: development of treatment plan with patient and/or surrogate as well as nursing, discussions with consultants, evaluation of patient's response to treatment,  examination of patient, obtaining history from patient or surrogate, ordering and performing treatments and interventions, ordering and review of laboratory studies, ordering and review of radiographic studies, pulse oximetry and re-evaluation of patient's condition.   Final Clinical Impressions(s) / ED Diagnoses   Final diagnoses:  Status epilepticus [facility])    ED Discharge Orders    None       Rolan Bucco, MD 05/12/17 [phone]

## 2017-05-12 NOTE — Progress Notes (Signed)
LTM EEG started per Dr Amada JupiterKirkpatrick. RN instructed on how to move equipment if pt move to a room tonight.

## 2017-05-12 NOTE — ED Notes (Signed)
EEG in process

## 2017-05-12 NOTE — ED Notes (Signed)
ICU Provider at bedside. 

## 2017-05-12 NOTE — ED Notes (Signed)
IV team attempted to start 2nd IV x 2 but was unsuccessful due to patient slightly combative and moving around-Monique,RN

## 2017-05-12 NOTE — ED Triage Notes (Signed)
Pt arrives via EMS after having 2 witnessed seizures while in route to dialysis. Pt received 5mg  versed im pta. Post ictal on arrival. EMS reports pt attempted to walk to truck after first seizure and was witnessed dragging right leg. Right arm and leg drift present in triage

## 2017-05-12 NOTE — Progress Notes (Addendum)
Pharmacy Antibiotic Note  Alexis ChengDora Sims is a [age] y.o. female admitted on 05/12/2017 with aspiration PNA.  Pharmacy has been consulted for zosyn dosing. Noted ESRD on HD.  Plan: Zosyn 3.375g IV (30ming infusion) given x 1 in the ED; then 3.375g IV q12h (4h infusion) Monitor clinical progress, c/s, abx plan/LOT F/u HD schedule/tolerance inpatient for antibiotic maintenance doses  ADDENDUM:  Pharmacy consulted to add vancomycin for PNA. Estimated weight ~90-100 lbs in the ED.  Plan: Vancomycin 1g IV x 1; then 500mg  IV qHD (not yet entered) Zosyn 3.375g IV q12h (4h infusion) Monitor clinical progress, c/s, abx plan/LOT F/u HD schedule/tolerance inpatient to enter vanc maintenance doses      Temp (24hrs), Avg:98.3 F (36.8 C), Min:98.3 F (36.8 C), Max:98.3 F (36.8 C)  Recent Labs  Lab 05/12/17 1343 05/12/17 1351  WBC 5.9  --   CREATININE 6.48* 6.40*    CrCl cannot be calculated (Unknown ideal weight.).    No Known Allergies  Alexis BertinHaley Boniface Sims, PharmD, BCPS Clinical Pharmacist 05/12/2017 5:56 PM

## 2017-05-12 NOTE — Consult Note (Signed)
PULMONARY / CRITICAL CARE MEDICINE   Name: Alexis Sims MRN: [id number] DOB: 26-Aug-1960    ADMISSION DATE:  05/12/2017 CONSULTATION DATE: 2/6  REFERRING MD: Dr. Benjamine Mola  CHIEF COMPLAINT: Seizure  HISTORY OF PRESENT ILLNESS:  Patient is encephalopathic and/or intubated. Therefore history has been obtained from chart review.  57 year old female with past medical history as below, which is significant for ESRD on HD, diabetes, CVA, and seizures.  She had has status epilepticus in the past requiring intubation.  On 2/6 while taking the bus to dialysis she had 2 witnessed seizures.  EMS was called and she had one additional seizure in route and another in the emergency department.  She was loaded with Keppra, fosphenytoin, and Dilantin under neurology guidance.  EEG was done in the emergency department showing no current seizures, however she did remain markedly lethargic in the postictal period.  PCCM asked to see for consideration of ICU admission with regards to poor airway protection.  PAST MEDICAL HISTORY :  She  has a past medical history of Blood clots in brain, CKD (chronic kidney disease) stage V requiring chronic dialysis (HCC), Diabetes mellitus type 2, insulin dependent (HCC), Gait disturbance, post-stroke, History of CVA (cerebrovascular accident), HTN (hypertension), and Seizures (HCC).  PAST SURGICAL HISTORY: She  has a past surgical history that includes [facility] and AV fistula placement.  No Known Allergies  No current facility-administered medications on file prior to encounter.    Current Outpatient Medications on File Prior to Encounter  Medication Sig  . amLODipine (NORVASC) 5 MG tablet Take 5 mg by mouth at bedtime.  [name] 1 GM 210 MG(Fe) tablet Take 840 mg by mouth 3 (three) times daily with meals.  [name] LANTUS SOLOSTAR 100 UNIT/ML Solostar Pen Inject 15 Units into the skin at bedtime.  . VELPHORO 500 MG chewable tablet Chew 1,000 mg by mouth 3 (three) times daily.     FAMILY HISTORY:  Her has no family status information on file.    SOCIAL HISTORY: She  reports that she has been smoking cigarettes and pipe.  She has been smoking about 0.50 packs per day. she has never used smokeless tobacco. She reports that she does not drink alcohol or use drugs.  REVIEW OF SYSTEMS:   Unable as patient is encephalopathic  SUBJECTIVE:    VITAL SIGNS: BP (!) 159/105   Pulse (!) 131   Temp 98.3 F (36.8 C) (Oral)   Resp (!) 44   SpO2 98%   HEMODYNAMICS:    VENTILATOR SETTINGS:    INTAKE / OUTPUT: I/O last 3 completed shifts: In: 32 [IV Piggyback:60] Out: -   PHYSICAL EXAMINATION: General: Adult female of normal body habitus in no acute distress Neuro: Lethargic, does arouse to tactile stimuli briefly before falling back asleep.  She does not follow commands. HEENT: [place]/AT, PERRLA, mild JVD. Cardiovascular: Tachycardic, regular, no MRG  Lungs: Tachypneic, clear shallow breath sounds Abdomen: Soft, nontender, nondistended Musculoskeletal:  No acute deformity Skin: Grossly intact  LABS:  BMET Recent Labs  Lab 05/12/17 1343 05/12/17 1351  NA 141 138  K 4.4 4.2  CL 95* 96*  CO2 29  --   BUN 41* 38*  CREATININE 6.48* 6.40*  GLUCOSE 160* 157*    Electrolytes Recent Labs  Lab 05/12/17 1343  CALCIUM 9.8    CBC Recent Labs  Lab 05/12/17 1343 05/12/17 1351  WBC 5.9  --   HGB 12.1 12.9  HCT 37.1 38.0  PLT 129*  --  Coag's No results for input(s): APTT, INR in the last 168 hours.  Sepsis Markers No results for input(s): LATICACIDVEN, PROCALCITON, O2SATVEN in the last 168 hours.  ABG No results for input(s): PHART, PCO2ART, PO2ART in the last 168 hours.  Liver Enzymes Recent Labs  Lab 05/12/17 1343  AST 68*  ALT 60*  ALKPHOS 128*  BILITOT 0.7  ALBUMIN 3.4*    Cardiac Enzymes No results for input(s): TROPONINI, PROBNP in the last 168 hours.  Glucose Recent Labs  Lab 05/12/17 1734  GLUCAP 181*     Imaging Ct Head Wo Contrast  Result Date: 05/12/2017 CLINICAL DATA:  Seizure.  Hypertension EXAM: CT HEAD WITHOUT CONTRAST TECHNIQUE: Contiguous axial images were obtained from the base of the skull through the vertex without intravenous contrast. COMPARISON:  None. FINDINGS: Brain: Encephalomalacia in the anterior left frontal lobe underlying a calvarial defect, possibly burr hole or craniectomy defect. Mild cerebral atrophy. Ventriculomegaly likely related to ex vacuo dilatation. No hemorrhage. There is an area of low-density in the left temporal lobe concerning for infarct, age indeterminate. Vascular: No hyperdense vessel or unexpected calcification. Skull: No acute calvarial abnormality. Sinuses/Orbits: Visualized paranasal sinuses and mastoids clear. Orbital soft tissues unremarkable. Other: None IMPRESSION: Area of encephalomalacia within the anterior left frontal lobe underlying calvarial defects, likely burr hole. Area of low-density in the left temporal lobe, age indeterminate infarct. Atrophy, associated ventriculomegaly. Electronically Signed   By: Charlett Nose M.D.   On: 05/12/2017 12:53   Dg Chest Port 1 View  Result Date: 05/12/2017 CLINICAL DATA:  Seizure today with aspiration pneumonia. EXAM: PORTABLE CHEST 1 VIEW COMPARISON:  05/31/2015 FINDINGS: Mild cardiomegaly. Mediastinal shadows are normal. There chronic lung markings, but interstitial and alveolar density appears accentuated. This is more consistent with acute edema. Density is slightly more focally prominent in the left lower lobe where there could be some aspiration. No effusions. No acute bone finding. Multiple vascular stents remain evident on the right. IMPRESSION: Interstitial and alveolar density consistent with edema superimposed upon chronic lung disease. More focal density in the left lower lobe could be aspiration. Electronically Signed   By: Paulina Fusi M.D.   On: 05/12/2017 17:26     STUDIES:  CT head 2/6 > Area  of encephalomalacia within the anterior left frontal lobe underlying calvarial defects, likely burr hole. Area of low-density in the left temporal lobe, age indeterminate infarct. Atrophy, associated ventriculomegaly.  CULTURES:   ANTIBIOTICS: RVP 2/6 > Blood cultures 2/6 >  SIGNIFICANT EVENTS:   LINES/TUBES:   DISCUSSION: 57 year old female with ESRD on HD and prior history of CVA and seizure presented with multiple witnessed seizures. Abated with ativan and started on Keppra, Dilantin, and fosphenytoin in ED. Remained significantly lethargic in post-ictal setting. Admitted to ICU for close monitoring.   ASSESSMENT / PLAN:  PULMONARY A: Acute hypoxic respiratory failure: volume overload vs CAP (Aspiration)? At risk for poor airway protection  P:   Close airway monitoring in ICU Supplemental O2 to keep sats > 92% (3L currently - turned up after PaO2 66 on ABG) IS when able May need intubation  CARDIOVASCULAR A:  HTN  P:  Tele monitoring Holding home amlodipine while NPO PRN hydralazine  RENAL A:   ESRD on HD  P:   Consult nephrology in AM Follow BMP  GASTROINTESTINAL A:   Transaminitis  P:   F/u LFT  HEMATOLOGIC A:   Thrombocytopenia  P:  Follow CBC Sub Q heparin  INFECTIOUS A:   ?  Aspiration PNA P:   Zosyn continue > can likely narrow tomorrow Cultures PCT   ENDOCRINE A:   DM2 P:   CBG monitoring and SSI  NEUROLOGIC A:   Status epilepticus with history of seizure: No active seizure on EEG, but remains post-ictal History of CVA P:   RASS goal: 0 to -1 AEDs per neurology (Keppra, fosphenytoin, phenytoin) Continuous EEG Neuro checks Seizure precautions   FAMILY  - Updates: no family available  - Inter-disciplinary family meet or Palliative Care meeting due by:  2/13   Joneen RoachPaul Hoffman, AGACNP-BC [facility] Pager [phone] or [phone]  05/12/2017 8:02 PM

## 2017-05-12 NOTE — Progress Notes (Signed)
STAT EEG completed; results pending. 

## 2017-05-12 NOTE — ED Notes (Signed)
pts eyes closed and she does not respond to questions asked

## 2017-05-12 NOTE — ED Notes (Signed)
Dr. Amada JupiterKirkpatrick and Neuro PA in room and pt actively seizing. Ativan to be given. Airway intact, suction at bedside.

## 2017-05-13 DIAGNOSIS — J690 Pneumonitis due to inhalation of food and vomit: Secondary | ICD-10-CM

## 2017-05-13 DIAGNOSIS — G9340 Encephalopathy, unspecified: Secondary | ICD-10-CM

## 2017-05-13 DIAGNOSIS — G40901 Epilepsy, unspecified, not intractable, with status epilepticus: Secondary | ICD-10-CM

## 2017-05-13 DIAGNOSIS — A419 Sepsis, unspecified organism: Secondary | ICD-10-CM

## 2017-05-13 LAB — COMPREHENSIVE METABOLIC PANEL
ALBUMIN: 3.2 g/dL — AB (ref 3.5–5.0)
ALK PHOS: 103 U/L (ref 38–126)
ALT: 48 U/L (ref 14–54)
AST: 52 U/L — AB (ref 15–41)
Anion gap: 19 — ABNORMAL HIGH (ref 5–15)
BILIRUBIN TOTAL: 0.9 mg/dL (ref 0.3–1.2)
BUN: 47 mg/dL — AB (ref 6–20)
CALCIUM: 8.9 mg/dL (ref 8.9–10.3)
CO2: 27 mmol/L (ref 22–32)
Chloride: 94 mmol/L — ABNORMAL LOW (ref 101–111)
Creatinine, Ser: 7.71 mg/dL — ABNORMAL HIGH (ref 0.44–1.00)
GFR calc Af Amer: 6 mL/min — ABNORMAL LOW (ref >60)
GFR, EST NON AFRICAN AMERICAN: 5 mL/min — AB (ref >60)
GLUCOSE: 153 mg/dL — AB (ref 65–99)
Potassium: 4.7 mmol/L (ref 3.5–5.1)
Sodium: 140 mmol/L (ref 135–145)
TOTAL PROTEIN: 6.6 g/dL (ref 6.5–8.1)

## 2017-05-13 LAB — RESPIRATORY PANEL BY PCR
Adenovirus: NOT DETECTED
Bordetella pertussis: NOT DETECTED
CORONAVIRUS NL63-RVPPCR: NOT DETECTED
CORONAVIRUS OC43-RVPPCR: NOT DETECTED
Chlamydophila pneumoniae: NOT DETECTED
Coronavirus 229E: NOT DETECTED
Coronavirus HKU1: NOT DETECTED
INFLUENZA A H1 2009-RVPPR: NOT DETECTED
INFLUENZA A H3-RVPPCR: NOT DETECTED
Influenza A H1: NOT DETECTED
Influenza A: NOT DETECTED
Influenza B: NOT DETECTED
METAPNEUMOVIRUS-RVPPCR: NOT DETECTED
MYCOPLASMA PNEUMONIAE-RVPPCR: NOT DETECTED
PARAINFLUENZA VIRUS 1-RVPPCR: NOT DETECTED
PARAINFLUENZA VIRUS 2-RVPPCR: NOT DETECTED
PARAINFLUENZA VIRUS 3-RVPPCR: NOT DETECTED
Parainfluenza Virus 4: NOT DETECTED
RHINOVIRUS / ENTEROVIRUS - RVPPCR: NOT DETECTED
Respiratory Syncytial Virus: NOT DETECTED

## 2017-05-13 LAB — PROCALCITONIN: Procalcitonin: 0.6 ng/mL

## 2017-05-13 LAB — CBC
HEMATOCRIT: 34.1 % — AB (ref 36.0–46.0)
Hemoglobin: 10.7 g/dL — ABNORMAL LOW (ref 12.0–15.0)
MCH: 27 pg (ref 26.0–34.0)
MCHC: 31.4 g/dL (ref 30.0–36.0)
MCV: 85.9 fL (ref 78.0–100.0)
PLATELETS: 109 10*3/uL — AB (ref 150–400)
RBC: 3.97 MIL/uL (ref 3.87–5.11)
RDW: 14.8 % (ref 11.5–15.5)
WBC: 6.6 10*3/uL (ref 4.0–10.5)

## 2017-05-13 LAB — CBG MONITORING, ED: GLUCOSE-CAPILLARY: 120 mg/dL — AB (ref 65–99)

## 2017-05-13 LAB — GLUCOSE, CAPILLARY
GLUCOSE-CAPILLARY: 151 mg/dL — AB (ref 65–99)
GLUCOSE-CAPILLARY: 80 mg/dL (ref 65–99)
GLUCOSE-CAPILLARY: 94 mg/dL (ref 65–99)
GLUCOSE-CAPILLARY: 97 mg/dL (ref 65–99)
Glucose-Capillary: 88 mg/dL (ref 65–99)
Glucose-Capillary: 99 mg/dL (ref 65–99)

## 2017-05-13 MED ORDER — ORAL CARE MOUTH RINSE
15.0000 mL | Freq: Two times a day (BID) | OROMUCOSAL | Status: DC
  Administered 2017-05-13 – 2017-05-18 (×9): 15 mL via OROMUCOSAL

## 2017-05-13 MED ORDER — LIDOCAINE-PRILOCAINE 2.5-2.5 % EX CREA: 1.0000 "application " | TOPICAL_CREAM | CUTANEOUS | Status: DC | PRN

## 2017-05-13 MED ORDER — HYDRALAZINE HCL 20 MG/ML IJ SOLN: 10.0000 mg | Freq: Four times a day (QID) | INTRAMUSCULAR | Status: DC | PRN

## 2017-05-13 MED ORDER — DEXTROSE 50 % IV SOLN
INTRAVENOUS | Status: AC
  Administered 2017-05-13: 25 mL via INTRAVENOUS
  Filled 2017-05-13: qty 50

## 2017-05-13 MED ORDER — VANCOMYCIN HCL 500 MG IV SOLR
500.0000 mg | INTRAVENOUS | Status: DC
  Filled 2017-05-13: qty 500

## 2017-05-13 MED ORDER — LIDOCAINE HCL (PF) 1 % IJ SOLN: 5.0000 mL | INTRAMUSCULAR | Status: DC | PRN

## 2017-05-13 MED ORDER — HEPARIN SODIUM (PORCINE) 1000 UNIT/ML DIALYSIS: 1000.0000 [IU] | INTRAMUSCULAR | Status: DC | PRN

## 2017-05-13 MED ORDER — SODIUM CHLORIDE 0.9 % IV SOLN: 100.0000 mL | INTRAVENOUS | Status: DC | PRN

## 2017-05-13 MED ORDER — PENTAFLUOROPROP-TETRAFLUOROETH EX AERO: 1.0000 "application " | INHALATION_SPRAY | CUTANEOUS | Status: DC | PRN

## 2017-05-13 MED ORDER — VANCOMYCIN HCL IN DEXTROSE 500-5 MG/100ML-% IV SOLN
INTRAVENOUS | Status: AC
  Administered 2017-05-13: 500 mg
  Filled 2017-05-13: qty 100

## 2017-05-13 MED ORDER — HEPARIN SODIUM (PORCINE) 1000 UNIT/ML DIALYSIS: 20.0000 [IU]/kg | INTRAMUSCULAR | Status: DC | PRN

## 2017-05-13 MED ORDER — DEXTROSE 50 % IV SOLN
0.5000 | Freq: Once | INTRAVENOUS | Status: AC
  Administered 2017-05-13: 25 mL via INTRAVENOUS

## 2017-05-13 NOTE — ED Notes (Signed)
The pt blood sugar 120 no insulin ordered for this cbg.  Pt speqks only one word when aggaavated

## 2017-05-13 NOTE — Progress Notes (Signed)
Upon arrival pt sats were <90 on NRB at 10L. Pt was also very lethargic and not following commands. Increase to 15L on NRB to maintain sats >90. Will continue to monitor pt.

## 2017-05-13 NOTE — Progress Notes (Signed)
Pt desating in low 80s on 6L [place]. Placed patient on NRB to maintain sats >90.

## 2017-05-13 NOTE — Progress Notes (Signed)
Subjective: Bedside RN present at bedside.  She has not noted any further seizure activity.  EEG is ongoing.  She is he is on non-rebreather supplemental oxygen at 15 liters breathing about 30 times per minute.  Now undergoing HD.  Exam: Vitals:   05/13/17 0757 05/13/17 0800  BP:  116/72  Pulse:  (!) 109  Resp:  (!) 36  Temp: 98.1 F (36.7 C)   SpO2:  95%   Gen: Ill-appearing woman lying and restless in the bed Resp: breath sounds are coarse and there is rhonchi throughout Abd: soft, non-tender, non-distended Ext: No edema, she has a right UE fistula with thrill  Neuro: MS: Opens eyes to voice, aphasic, withdraws from pain, voices her name when asked, able to stick out her tongue. CN: She does not react to threat, left pupil is reactive, right eye is cloudy and non-reactive, no facial assymetry observed, tongue is midline Motor: Normal tone and bulk and moving all four extremities in the bed. Sensory: Withdraws to painful stimuli DTR: 2+ and symmetric   Pertinent Labs: Na 140, K 4.7, Cl 94, CO2 27, BUN 47, Cr 7.7 WBC 6.6, Hgb 10.7, HCT 34, PLT 109 Procalcitonin 0.60  Impression: 57 y.o. woman admitted in status epilepticus with complex partial seizure involving right arm and leg.  Seizures were able to be broken with Keppra and Fosphenytoin.  Likely history of previous stroke as the etiology of presenting seizures.  Remains encephalopathic today perhaps related to medication vs prolonged post-ictal state.  Recommendations: 1) Continue Keppra 1000mg  daily 2) Continue Dilantin 100mg  Q8H 3) MRI Brain when patient stable enough 4) Stop EEG    **This plan has yet to be discussed with attending neurologist**  Gwynn BurlyAndrew Jinx Gilden, PGY3 05/13/2017, 9:16 AM

## 2017-05-13 NOTE — ED Notes (Signed)
Pt untesponsive

## 2017-05-13 NOTE — Progress Notes (Signed)
LTM EEG discontinued.  

## 2017-05-13 NOTE — Progress Notes (Signed)
Pt on Ltm EEG checked for skin break down; none noted.  Impedances checked, serviced.  Continue to monitor.

## 2017-05-13 NOTE — Progress Notes (Signed)
PULMONARY / CRITICAL CARE MEDICINE   Name: Alexis Sims MRN: [id number] DOB: [birth year]    ADMISSION DATE:  05/12/2017 CONSULTATION DATE: 2/6  REFERRING MD: Dr. Benjamine Mola  CHIEF COMPLAINT: Seizure  BRIEF SUMMARY:  57 year old female with past medical history as below, which is significant for ESRD on HD, diabetes, CVA, and seizures.  She had has status epilepticus in the past requiring intubation.  On 2/6 while taking the bus to dialysis she had 2 witnessed seizures.  EMS was called and she had one additional seizure in route and another in the emergency department.  She was loaded with Keppra, fosphenytoin, and Dilantin under neurology guidance.  EEG was done in the emergency department showing no current seizures, however she did remain markedly lethargic in the postictal period.  PCCM asked to see for consideration of ICU admission with regards to poor airway protection.   SUBJECTIVE: Patient being dialyzed, sleeping in bed. Per nurse, patient is has increased work of breathing, becomes agitated and desaturating to 90% on 10 L non breather. Has increased to 15 L and maintaining at 94%.   VITAL SIGNS: BP (!) 185/92   Pulse (!) 111   Temp 98 F (36.7 C)   Resp (!) 35   Wt 103 lb 6.3 oz (46.9 kg)   SpO2 93%   BMI 21.61 kg/m   HEMODYNAMICS:    VENTILATOR SETTINGS:    INTAKE / OUTPUT: I/O last 3 completed shifts: In: 380 [Other:20; IV Piggyback:360] Out: -   PHYSICAL EXAMINATION: General: petite female laying in bed on dialysis machine HEENT: wearing non breather mask, atraumatic, normocephalic Cardiovascular: Regular rhythm, mildly tachycardic, no murmurs, no pitting edema in bilateral LE Lungs: Clear to auscultation bilaterally, increased work of breathing with accessory muscle use but no acute distress Abdomen: BS auscultated, soft, non-distended, without rigidity or guarding Skin: warm, dry.  LUE AVF   LABS:  BMET Recent Labs  Lab 05/12/17 1343 05/12/17 1351  05/13/17 0324  NA 141 138 140  K 4.4 4.2 4.7  CL 95* 96* 94*  CO2 29  --  27  BUN 41* 38* 47*  CREATININE 6.48* 6.40* 7.71*  GLUCOSE 160* 157* 153*    Electrolytes Recent Labs  Lab 05/12/17 1343 05/13/17 0324  CALCIUM 9.8 8.9    CBC Recent Labs  Lab 05/12/17 1343 05/12/17 1351 05/13/17 0324  WBC 5.9  --  6.6  HGB 12.1 12.9 10.7*  HCT 37.1 38.0 34.1*  PLT 129*  --  109*    Coag's No results for input(s): APTT, INR in the last 168 hours.  Sepsis Markers Recent Labs  Lab 05/13/17 0324  PROCALCITON 0.60    ABG Recent Labs  Lab 05/12/17 1946  PHART 7.461*  PCO2ART 46.9  PO2ART 66.0*    Liver Enzymes Recent Labs  Lab 05/12/17 1343 05/13/17 0324  AST 68* 52*  ALT 60* 48  ALKPHOS 128* 103  BILITOT 0.7 0.9  ALBUMIN 3.4* 3.2*    Cardiac Enzymes No results for input(s): TROPONINI, PROBNP in the last 168 hours.  Glucose Recent Labs  Lab 05/12/17 1734 05/12/17 2011 05/13/17 0017 05/13/17 0444 05/13/17 0752 05/13/17 0755  GLUCAP 181* 96 120* 151* 44* 80    Imaging Ct Head Wo Contrast  Result Date: 05/12/2017 CLINICAL DATA:  Seizure.  Hypertension EXAM: CT HEAD WITHOUT CONTRAST TECHNIQUE: Contiguous axial images were obtained from the base of the skull through the vertex without intravenous contrast. COMPARISON:  None. FINDINGS: Brain: Encephalomalacia in the anterior left frontal  lobe underlying a calvarial defect, possibly burr hole or craniectomy defect. Mild cerebral atrophy. Ventriculomegaly likely related to ex vacuo dilatation. No hemorrhage. There is an area of low-density in the left temporal lobe concerning for infarct, age indeterminate. Vascular: No hyperdense vessel or unexpected calcification. Skull: No acute calvarial abnormality. Sinuses/Orbits: Visualized paranasal sinuses and mastoids clear. Orbital soft tissues unremarkable. Other: None IMPRESSION: Area of encephalomalacia within the anterior left frontal lobe underlying calvarial  defects, likely burr hole. Area of low-density in the left temporal lobe, age indeterminate infarct. Atrophy, associated ventriculomegaly. Electronically Signed   By: Charlett Nose M.D.   On: 05/12/2017 12:53   Dg Chest Port 1 View  Result Date: 05/12/2017 CLINICAL DATA:  Seizure today with aspiration pneumonia. EXAM: PORTABLE CHEST 1 VIEW COMPARISON:  05/31/2015 FINDINGS: Mild cardiomegaly. Mediastinal shadows are normal. There chronic lung markings, but interstitial and alveolar density appears accentuated. This is more consistent with acute edema. Density is slightly more focally prominent in the left lower lobe where there could be some aspiration. No effusions. No acute bone finding. Multiple vascular stents remain evident on the right. IMPRESSION: Interstitial and alveolar density consistent with edema superimposed upon chronic lung disease. More focal density in the left lower lobe could be aspiration. Electronically Signed   By: Paulina Fusi M.D.   On: 05/12/2017 17:26     STUDIES:  CT head 2/6 >> Area of encephalomalacia within the anterior left frontal lobe underlying calvarial defects, likely burr hole. Area of low-density in the left temporal lobe, age indeterminate infarct. Atrophy, associated ventriculomegaly. CXR 2/6 >> pulmonary edema, possible aspiration in LLL  CULTURES: RVP 2/6 >> negative Blood 2/6 >>   ANTIBIOTICS: Vanc 2/6 >> Zosyn 2/6 >>  SIGNIFICANT EVENTS: 2/6 Admit to ICU  LINES/TUBES:    DISCUSSION: 57 year old female with ESRD on HD and prior history of CVA and seizure presented with multiple witnessed seizures. Abated with ativan and started on Keppra, Dilantin, and fosphenytoin in ED. Remained significantly lethargic in post-ictal setting. Admitted to ICU for close monitoring.   ASSESSMENT / PLAN:  PULMONARY A: Acute hypoxic respiratory failure: volume overload vs CAP (Aspiration)? Pulmonary Edema Concern for airway protection  P:   ICU  monitoring Supplemental O2 to maintain sats >92% Intermittent CXR Aspiration precautions  CARDIOVASCULAR A:  HTN P:  ICU monitoring Continue PRN hydralazine for SBP > 170 Continue to hold home amlodipine  RENAL A:   ESRD on HD P:   Nephrology following, appreciate input Trend BMP Avoid nephrotoxic agents, replace electrolytes as able  GASTROINTESTINAL A:   Transaminitis - improved P:   Monitor LFT's intermittently NPO until mental status improves  HEMATOLOGIC A:   Thrombocytopenia - admit platelets 146 P:  Trend CBC, platelets Heparin SQ for DVT prophylaxis  Monitor for bleeding   INFECTIOUS A:   ? Aspiration PNA P:   Follow cultures Discontinue vanco 2/7 Continue zosyn x5 days (2/5) unless cultures indicate otherwise Trend PCT   ENDOCRINE A:   DM2 P:   SSI  NEUROLOGIC A:   Status epilepticus with history of seizure - No active seizure on EEG, but remains post-ictal / benzo effect + poor clearance with CKD History of CVA P:   Neurology following, appreciate input AEDs per neuro, continuous EEG Seizure precautions   FAMILY  - Updates: None at bedside 2/7  - Inter-disciplinary family meet or Palliative Care meeting due by:  2/13  Willa Frater, PA-S   Canary Brim, NP-C [facility] Pgr: [phone] or if no answer [phone] 05/13/2017, 12:05 PM  Attending:  I have seen and examined the patient with nurse practitioner/resident and agree with the note above.  We formulated the plan together and I elicited the following history.    Subjective: Hypoxia  Objective: Vitals:   05/13/17 1420 05/13/17 1500 05/13/17 1528 05/13/17 1600  BP: 121/80 (!) 149/85  (!) 149/88  Pulse: 100 98  97  Resp: (!) 30 (!) 24  (!) 26  Temp:   97.9 F (36.6 C)   TempSrc:   Oral   SpO2: 95% 98%  98%  Weight:          Intake/Output Summary (Last 24 hours) at 05/13/2017 1715 Last data filed at 05/13/2017 1600 Gross per 24 hour  Intake  630 ml  Output 4000 ml  Net -3370 ml   PHYSICAL EXAMINATION: General: female laying in bed on dialysis machine. No distress observed. HEENT: wearing non breather mask, atraumatic, normocephalic. No JVD. Cardiovascular: Regular rhythm, mildly tachycardic, no murmurs, no pitting edema in bilateral LE Lungs: Bilateral rhonchi. No wheezes or crackles. Abdomen: BS auscultated, soft, non-distended, without rigidity or guarding Skin: warm, dry.  LUE AVF    CBC    Component Value Date/Time   WBC 6.6 05/13/2017 0324   RBC 3.97 05/13/2017 0324   HGB 10.7 (L) 05/13/2017 0324   HCT 34.1 (L) 05/13/2017 0324   PLT 109 (L) 05/13/2017 0324   MCV 85.9 05/13/2017 0324   MCH 27.0 05/13/2017 0324   MCHC 31.4 05/13/2017 0324   RDW 14.8 05/13/2017 0324   LYMPHSABS 1.2 05/12/2017 1343   MONOABS 0.4 05/12/2017 1343   EOSABS 0.2 05/12/2017 1343   BASOSABS 0.0 05/12/2017 1343    BMET    Component Value Date/Time   NA 140 05/13/2017 0324   K 4.7 05/13/2017 0324   CL 94 (L) 05/13/2017 0324   CO2 27 05/13/2017 0324   GLUCOSE 153 (H) 05/13/2017 0324   BUN 47 (H) 05/13/2017 0324   CREATININE 7.71 (H) 05/13/2017 0324   CALCIUM 8.9 05/13/2017 0324   GFRNONAA 5 (L) 05/13/2017 0324   GFRAA 6 (L) 05/13/2017 0324    CXR images  DG Chest Port 1 View  Final Result    CT Head Wo Contrast  Final Result    DG Chest Port 1 View    (Results Pending)     PULMONARY A: Acute hypoxic respiratory failure: volume overload vs CAP (Aspiration)? Pulmonary Edema Concern for airway protection  P:   ICU monitoring Supplemental O2 to maintain sats >92% Intermittent CXR Aspiration precautions Should improve with volume removal. CXR in the am.  CARDIOVASCULAR A:  HTN P:  ICU monitoring Continue PRN hydralazine for SBP > 170 Continue to hold home amlodipine  RENAL A:   ESRD on HD P:   Nephrology following, appreciate input. To receive nearly 4 hours of solute removal; goal according to the  nurse is 4 liters. Trend BMP Avoid nephrotoxic agents, replace electrolytes as able  GASTROINTESTINAL A:   Transaminitis - improved P:   Monitor LFT's intermittently NPO until mental status improves  HEMATOLOGIC A:   Thrombocytopenia - admit platelets 146 P:  Trend CBC, platelets Heparin SQ for DVT prophylaxis  Monitor for bleeding   INFECTIOUS A:   ? Aspiration PNA P:   Follow cultures Discontinue vanco 2/7 Continue zosyn x5 days (2/5) unless cultures indicate otherwise Trend PCT   ENDOCRINE A:   DM2 P:   SSI  NEUROLOGIC A:   Status epilepticus with history of seizure - No active seizure on EEG, but remains post-ictal / benzo effect + poor clearance with CKD History of CVA P:   Neurology following, appreciate input AEDs per neuro, continuous EEG Seizure precautions   My cc time 40 minutes  Elayne SnareMichael B Aretta Stetzel, MD [facility] Pager: [phone]

## 2017-05-13 NOTE — ED Notes (Signed)
Report given to rn on 4n 

## 2017-05-13 NOTE — Progress Notes (Signed)
Arrived to patient room 4N-19 at 1019.  Reviewed treatment plan and this RN agrees.  Report received from bedside RN, Clarita CraneKeely.  Consent obtained per sister, POA d/t AMS.  Patient opens eyes to voice, not commanding, non verbal. Lung sounds coarse to ausculation in all fields with notable increased WOB. Generalized edema. Cardiac: ST.  Prepped RUAVG with alcohol and cannulated with two 15 gauge needles.  Pulsation of blood noted.  Flushed access well with saline per protocol.  Connected and secured lines and initiated tx at 1030.  UF goal of 4500 mL and net fluid removal of 4000 mL.  Will continue to monitor.

## 2017-05-13 NOTE — Progress Notes (Signed)
Dialysis treatment completed.  4500 mL ultrafiltrated and net fluid removal 4000 mL.    Patient status unchanged. Lung sounds course and diminished to ausculation in all fields. Generalized edema. Cardiac: NSR.  Disconnected lines and removed needles.  Pressure held for 10 minutes and band aid/gauze dressing applied.  Report given to bedside RN, Darl PikesSusan.

## 2017-05-13 NOTE — ED Notes (Signed)
Position chenged

## 2017-05-13 NOTE — Consult Note (Addendum)
[facility] Renal Consultation Note    Indication for Consultation:  Management of ESRD/hemodialysis; anemia, hypertension/volume and secondary hyperparathyroidism  HPI: Alexis Sims is a 57 y.o. female with [facility] on HD [facility] DM, HTN, hx CVA, seizure disorder, Hep C.   Admitted with status epilepticus that began on her way to dialysis yesterday. Seizure activity continued in ED and treated with Ativan, Keppra, Dilantin. EEG last night consistent with focal status epilepticus resolved with treatment. Concern for aspiration with CXR showing pulm edema and focal density in LLL. Elevated temp on admission (101.22F), blood cultures collected and antibiotics started for possible aspiration PNA. Labs this am K 4.7, CO2 26, glucose 153, Ca 8.9,  WBC 6.6, hgb 10.7.    Seen in ICU,  awake and restless in bed, not able to follow commands. Requiring NRB at 15L to maintain O2 sats.   Last HD was Monday. She has not been meeting her dry weight, relatively high fluid gains for her small size. Left 4.7kg over EDW on Monday.    Past Medical History:  Diagnosis Date  . Blood clots in brain   . CKD (chronic kidney disease) stage V requiring chronic dialysis (HCC)   . Diabetes mellitus type 2, insulin dependent (HCC)   . Gait disturbance, post-stroke   . History of CVA (cerebrovascular accident)   . HTN (hypertension)   . Seizures (HCC)    Past Surgical History:  Procedure Laterality Date  . AV FISTULA PLACEMENT    . BURR HOLE     History reviewed. No pertinent family history. Social History:  reports that she has been smoking cigarettes and pipe.  She has been smoking about 0.50 packs per day. she has never used smokeless tobacco. She reports that she does not drink alcohol or use drugs. No Known Allergies Prior to Admission medications   Medication Sig Start Date End Date Taking? Authorizing Provider  amLODipine (NORVASC) 5 MG tablet Take 5 mg by  mouth daily.  04/22/17   [provider]  AURYXIA 1 GM 210 MG(Fe) tablet Take 840 mg by mouth 3 (three) times daily with meals. 05/10/17   [provider]  LANTUS SOLOSTAR 100 UNIT/ML Solostar Pen Inject 15 Units into the skin at bedtime. 04/22/17   [provider]  VELPHORO 500 MG chewable tablet Chew 1,000 mg by mouth 3 (three) times daily. 03/18/15   [provider]   Current Facility-Administered Medications  Medication Dose Route Frequency Provider Last Rate Last Dose  . acetaminophen (TYLENOL) suppository 650 mg  650 mg Rectal Q6H PRN Russella DarEllis, Allison L, NP      . heparin injection 5,000 Units  5,000 Units Subcutaneous Q8H Russella DarEllis, Allison L, NP   5,000 Units at 05/13/17 0520  . hydrALAZINE (APRESOLINE) injection 10 mg  10 mg Intravenous Q6H PRN Russella DarEllis, Allison L, NP      . insulin aspart (novoLOG) injection 0-9 Units  0-9 Units Subcutaneous Q4H Russella DarEllis, Allison L, NP   2 Units at 05/13/17 0445  . levETIRAcetam (KEPPRA) 1,000 mg in sodium chloride 0.9 % 100 mL IVPB  1,000 mg Intravenous Daily Rejeana BrockKirkpatrick, McNeill P, MD      . LORazepam (ATIVAN) injection 2 mg  2 mg Intravenous Once Steward RosSmith, David R, PA-C   Stopped at 05/12/17 1730  . ondansetron (ZOFRAN) injection 4 mg  4 mg Intravenous Q6H PRN Russella DarEllis, Allison L, NP      . phenytoin (DILANTIN) injection 100 mg  100 mg  Intravenous Q8H Ulice Dash, PA-C   100 mg at 05/13/17 6962  . piperacillin-tazobactam (ZOSYN) IVPB 3.375 g  3.375 g Intravenous Q12H Almon Hercules, RPH 12.5 mL/hr at 05/13/17 0520 3.375 g at 05/13/17 0520  . sodium chloride flush (NS) 0.9 % injection 3 mL  3 mL Intravenous Q12H Russella Dar, NP      . vancomycin (VANCOCIN) 500 mg in sodium chloride 0.9 % 100 mL IVPB  500 mg Intravenous Q Thu-HD Hammonds, Curt Jews, MD        ROS: As per HPI otherwise negative.  Physical Exam: Vitals:   05/13/17 0700 05/13/17 0757 05/13/17 0800 05/13/17 0900  BP: (!) 159/76  116/72 (!) 149/90  Pulse: (!)  101  (!) 109 (!) 107  Resp: (!) 33  (!) 36 (!) 31  Temp:  98.1 F (36.7 C)    TempSrc:  Oral    SpO2: 94%  95% 96%  Weight:    46.9 kg (103 lb 6.3 oz)     General: Ill appearing small female, restless in bed, wearing NRB mask Head: NCAT sclera not icteric MMM Neck: Supple. Unable to assess JVD Lungs: Coarse rhonchi/crackles throughout  Heart: Tachy rhythm  Abdomen: soft NT  Lower extremities:without edema or ischemic changes, no open wounds  Neuro: Encephalopathic Not responsive to commands   Dialysis Access: RUE AVG+bruit   Labs: Basic Metabolic Panel: Recent Labs  Lab 05/12/17 1343 05/12/17 1351 05/13/17 0324  NA 141 138 140  K 4.4 4.2 4.7  CL 95* 96* 94*  CO2 29  --  27  GLUCOSE 160* 157* 153*  BUN 41* 38* 47*  CREATININE 6.48* 6.40* 7.71*  CALCIUM 9.8  --  8.9   Liver Function Tests: Recent Labs  Lab 05/12/17 1343 05/13/17 0324  AST 68* 52*  ALT 60* 48  ALKPHOS 128* 103  BILITOT 0.7 0.9  PROT 7.1 6.6  ALBUMIN 3.4* 3.2*   No results for input(s): LIPASE, AMYLASE in the last 168 hours. No results for input(s): AMMONIA in the last 168 hours. CBC: Recent Labs  Lab 05/12/17 1343 05/12/17 1351 05/13/17 0324  WBC 5.9  --  6.6  NEUTROABS 4.1  --   --   HGB 12.1 12.9 10.7*  HCT 37.1 38.0 34.1*  MCV 85.5  --  85.9  PLT 129*  --  109*   Cardiac Enzymes: No results for input(s): CKTOTAL, CKMB, CKMBINDEX, TROPONINI in the last 168 hours. CBG: Recent Labs  Lab 05/12/17 2011 05/13/17 0017 05/13/17 0444 05/13/17 0752 05/13/17 0755  GLUCAP 96 120* 151* 44* 80   Iron Studies: No results for input(s): IRON, TIBC, TRANSFERRIN, FERRITIN in the last 72 hours. Studies/Results: Ct Head Wo Contrast  Result Date: 05/12/2017 CLINICAL DATA:  Seizure.  Hypertension EXAM: CT HEAD WITHOUT CONTRAST TECHNIQUE: Contiguous axial images were obtained from the base of the skull through the vertex without intravenous contrast. COMPARISON:  None. FINDINGS: Brain:  Encephalomalacia in the anterior left frontal lobe underlying a calvarial defect, possibly burr hole or craniectomy defect. Mild cerebral atrophy. Ventriculomegaly likely related to ex vacuo dilatation. No hemorrhage. There is an area of low-density in the left temporal lobe concerning for infarct, age indeterminate. Vascular: No hyperdense vessel or unexpected calcification. Skull: No acute calvarial abnormality. Sinuses/Orbits: Visualized paranasal sinuses and mastoids clear. Orbital soft tissues unremarkable. Other: None IMPRESSION: Area of encephalomalacia within the anterior left frontal lobe underlying calvarial defects, likely burr hole. Area of low-density in the left temporal lobe,  age indeterminate infarct. Atrophy, associated ventriculomegaly. Electronically Signed   By: Charlett Nose M.D.   On: 05/12/2017 12:53   Dg Chest Port 1 View  Result Date: 05/12/2017 CLINICAL DATA:  Seizure today with aspiration pneumonia. EXAM: PORTABLE CHEST 1 VIEW COMPARISON:  05/31/2015 FINDINGS: Mild cardiomegaly. Mediastinal shadows are normal. There chronic lung markings, but interstitial and alveolar density appears accentuated. This is more consistent with acute edema. Density is slightly more focally prominent in the left lower lobe where there could be some aspiration. No effusions. No acute bone finding. Multiple vascular stents remain evident on the right. IMPRESSION: Interstitial and alveolar density consistent with edema superimposed upon chronic lung disease. More focal density in the left lower lobe could be aspiration. Electronically Signed   By: Paulina Fusi M.D.   On: 05/12/2017 17:26    Dialysis Orders:  [facility] MWF  3.5h 160F BFR 450/A1.5x EDW 38kg 2K/2Ca Profile 4 R AVG Hep 1300 Hectorol IV TIW  Assessment/Plan: 1. Seziure disorder/Status epilepticus - meds per neuro  2. Pulm edema/possible aspiration PNA - Blood cultures pending. IV Vanc/Zosyn per primary. HD for volume removal  3. ESRD -   MWF. Missed tmt Wed. For HD today off schedule 4. Hypertension/volume  - BP elevated. Volume on exam/CXR. UF goal 4L today  5. Anemia  - Hgb 10.7. Follow trend. Not on ESA as outpatient  6. Metabolic bone disease -  Ca ok. Continue Hectorol/Auryxia binder  7. DM SSI per primary 8. Hx CVA   Tomasa Blase PA-C [facility] Pager [phone] 05/13/2017, 9:24 AM   Pt seen, examined and agree w A/P as above.  ESRD pt with seizures and low dilantin level. Post-ictal now.  Also has resp insuff and pulm edema d/t vol overload.  On HD now, max vol removal as tolerated.  Will follow.  Vinson Moselle MD [employer] pager [phone]   05/13/2017, 11:49 AM

## 2017-05-13 NOTE — Procedures (Signed)
LTM-EEG Report  HISTORY: Continuous video-EEG monitoring performed for 57 year old with L focal status epilepticus.  ACQUISITION: International 10-20 system for electrode placement; 18 channels with additional eyes linked to ipsilateral ears and EKG. Additional T1-T2 electrodes were used. Continuous video recording obtained.   EEG NUMBER:  MEDICATIONS:  Day 1: LEV, PHT, LZP  DAY #1: from 1557 05/12/17 to 0730 05/13/17 BACKGROUND: An overall medium voltage continuous recording with some spontaneous variability and reactivity. The background consisted of medium voltage delta-theta activity bilaterally with sparse superimposed faster frequencies. There was no evidence of a posterior basic rhythm. State changes were captured, however no clear stage II sleep architecture was seen. EPILEPTIFORM/PERIODIC ACTIVITY: Rare, isolated poorly formed sharp waves in the left frontotemporal region, markedly improved compared to the patient's spot EEG from earlier in the day SEIZURES: none EVENTS: none  EKG: no significant arrhythmia  SUMMARY: This was an abnormal continuous video EEG due to loss of normal background, generalized slowing, and rare poorly-formed epileptiform activity on the left. No seizures were seen. This was indicative of an encephalopathy pattern with a superimposed mild focal epileptogenic disturbance.

## 2017-05-13 NOTE — Procedures (Signed)
   I was present at this dialysis session, have reviewed the session itself and made  appropriate changes Vinson Moselleob Thedore Pickel MD [facility] pager [phone]   05/13/2017, 11:51 AM

## 2017-05-14 ENCOUNTER — Inpatient Hospital Stay (HOSPITAL_COMMUNITY): Payer: Medicaid Other

## 2017-05-14 LAB — CBC
HCT: 34.3 % — ABNORMAL LOW (ref 36.0–46.0)
Hemoglobin: 11 g/dL — ABNORMAL LOW (ref 12.0–15.0)
MCH: 27.4 pg (ref 26.0–34.0)
MCHC: 32.1 g/dL (ref 30.0–36.0)
MCV: 85.3 fL (ref 78.0–100.0)
Platelets: 101 10*3/uL — ABNORMAL LOW (ref 150–400)
RBC: 4.02 MIL/uL (ref 3.87–5.11)
RDW: 14.7 % (ref 11.5–15.5)
WBC: 5.9 10*3/uL (ref 4.0–10.5)

## 2017-05-14 LAB — GLUCOSE, CAPILLARY
GLUCOSE-CAPILLARY: 44 mg/dL — AB (ref 65–99)
Glucose-Capillary: 111 mg/dL — ABNORMAL HIGH (ref 65–99)
Glucose-Capillary: 132 mg/dL — ABNORMAL HIGH (ref 65–99)
Glucose-Capillary: 76 mg/dL (ref 65–99)
Glucose-Capillary: 78 mg/dL (ref 65–99)
Glucose-Capillary: 84 mg/dL (ref 65–99)

## 2017-05-14 LAB — BASIC METABOLIC PANEL
Anion gap: 23 — ABNORMAL HIGH (ref 5–15)
BUN: 26 mg/dL — ABNORMAL HIGH (ref 6–20)
CALCIUM: 9 mg/dL (ref 8.9–10.3)
CO2: 21 mmol/L — AB (ref 22–32)
CREATININE: 5.35 mg/dL — AB (ref 0.44–1.00)
Chloride: 93 mmol/L — ABNORMAL LOW (ref 101–111)
GFR calc non Af Amer: 8 mL/min — ABNORMAL LOW (ref >60)
GFR, EST AFRICAN AMERICAN: 9 mL/min — AB (ref >60)
Glucose, Bld: 132 mg/dL — ABNORMAL HIGH (ref 65–99)
Potassium: 4.7 mmol/L (ref 3.5–5.1)
SODIUM: 137 mmol/L (ref 135–145)

## 2017-05-14 LAB — PHENYTOIN LEVEL, TOTAL: PHENYTOIN LVL: 22 ug/mL — AB (ref 10.0–20.0)

## 2017-05-14 LAB — PROCALCITONIN: PROCALCITONIN: 1.41 ng/mL

## 2017-05-14 MED ORDER — LORAZEPAM 2 MG/ML IJ SOLN
0.5000 mg | Freq: Once | INTRAMUSCULAR | Status: AC
  Administered 2017-05-14: 0.5 mg via INTRAVENOUS
  Filled 2017-05-14: qty 1

## 2017-05-14 MED ORDER — PHENYTOIN SODIUM 50 MG/ML IJ SOLN: 100.0000 mg | Freq: Three times a day (TID) | INTRAMUSCULAR | Status: DC

## 2017-05-14 MED ORDER — SODIUM CHLORIDE 0.9 % IV SOLN: 500.0000 mg | INTRAVENOUS | Status: DC

## 2017-05-14 NOTE — Progress Notes (Signed)
Dialysis treatment completed.  3000 mL ultrafiltrated and net fluid removal 2500 mL.    Patient status unchanged. Lung sounds diminished to ausculation in all fields. No edema. Cardiac: NSR.  Disconnected lines and removed needles.  Pressure held for 10 minutes and band aid/gauze dressing applied.  Report given to bedside RN, Robby Sermonekecia.

## 2017-05-14 NOTE — Progress Notes (Signed)
Patient arrived to unit per bed.  Reviewed treatment plan and this RN agrees.  Report received from bedside RN, Robby Sermonekecia.  Consent verified.  Patient Responds to voice, oriented to person only. Lung sounds diminished and coarse to ausculation in all fields. No edema. Cardiac: NSR.  Prepped RUAVG with alcohol and cannulated with two 15 gauge needles.  Pulsation of blood noted.  Flushed access well with saline per protocol.  Connected and secured lines and initiated tx at 1020.  UF goal of 4500 mL and net fluid removal of 4000 mL.  Will continue to monitor.

## 2017-05-14 NOTE — Progress Notes (Signed)
Dr. Hyacinth MeekerMiller at bedside and made aware patient's sister and POA Nettie ElmSylvia at bedside and wishes to change the patient's CODE STATUS. Also made aware of continued altered mental status. POC discussed by MD with sister. Nursing to continue to monitor.

## 2017-05-14 NOTE — Progress Notes (Addendum)
Subjective: Somewhat more awake today  Exam: Vitals:   05/14/17 1600 05/14/17 1700  BP: (!) 162/84 (!) 156/91  Pulse: 91 88  Resp: (!) 25 18  Temp: 98.4 F (36.9 C)   SpO2: 95% 97%   Gen: In bed, NAD Resp: non-labored breathing, no acute distress Abd: soft, nt  Neuro: MS: Awake, able to tell me her name, able to count fingers but still quite confused CN: Left pupil reactive , R postsurgical visual fields full Motor: Moves all extremities symmetrically Sensory: Intact light touch  Pertinent Labs: Phenytoin 22  Impression: 57 year old female presenting with status epilepticus refractory to first-line interventions, but resolving with second line(phenytoin).   I suspect that her mental status remains largely due to medications, and her phenytoin level is high which could be contributing.  Her prolonged status epilepticus, however, could be playing a role as well.  Recommendations: 1)  hold p.m. phenytoin, recheck level tomorrow 2) Keppra 1 g daily with additional 500 mg after dialysis 3) neurology will continue to follow   Ritta SlotMcNeill Jarrod Mcenery, MD [facility] [phone]  If 7pm- 7am, please page neurology on call as listed in AMION.

## 2017-05-14 NOTE — Progress Notes (Signed)
PULMONARY / CRITICAL CARE MEDICINE   Name: Alexis Sims MRN: [id number] DOB: Apr 30, 1960    ADMISSION DATE:  05/12/2017 CONSULTATION DATE: 2/6  REFERRING MD: Dr. Benjamine Mola  CHIEF COMPLAINT: Seizure  BRIEF SUMMARY:  57 year old female with past medical history as below, which is significant for ESRD on HD, diabetes, CVA, and seizures.  She had has status epilepticus in the past requiring intubation.  On 2/6 while taking the bus to dialysis she had 2 witnessed seizures.  EMS was called and she had one additional seizure in route and another in the emergency department.  She was loaded with Keppra, fosphenytoin, and Dilantin under neurology guidance.  EEG was done in the emergency department showing no current seizures, however she did remain markedly lethargic in the postictal period.  PCCM asked to see for consideration of ICU admission with regards to poor airway protection.  SUBJECTIVE: Patient sleeping comfortably in bed, aroused by stimuli but does not wake up. Patients POA (sister) at bedside and has indicated the patient should be DNR status. 4L fluid removed yesterday during dialysis.  VITAL SIGNS: BP (!) 141/76   Pulse 90   Temp 98.1 F (36.7 C) (Axillary)   Resp (!) 30   Wt 90 lb 2.7 oz (40.9 kg)   SpO2 91%   BMI 18.85 kg/m   HEMODYNAMICS:    VENTILATOR SETTINGS: FiO2 (%):  [35 %-45 %] 35 %  INTAKE / OUTPUT: I/O last 3 completed shifts: In: 833 [I.V.:3; Other:220; IV Piggyback:610] Out: 4000 [Other:4000]  PHYSICAL EXAMINATION: General: petite female sleeping comfortably in bed, in no acute distress HEENT: atraumatic, normocephalic, MM moist Cardiovascular:  s1s2 rrr, no m/r/g, palpable thrill on RUE from AVF Lungs: Normal work of breathing, without accessory muscle use, faint basilar crackles  Abdomen: soft, non- distended Skin: warm, dry   LABS:  BMET Recent Labs  Lab 05/12/17 1343 05/12/17 1351 05/13/17 0324 05/14/17 0641  NA 141 138 140 137  K 4.4 4.2 4.7 4.7   CL 95* 96* 94* 93*  CO2 29  --  27 21*  BUN 41* 38* 47* 26*  CREATININE 6.48* 6.40* 7.71* 5.35*  GLUCOSE 160* 157* 153* 132*    Electrolytes Recent Labs  Lab 05/12/17 1343 05/13/17 0324 05/14/17 0641  CALCIUM 9.8 8.9 9.0    CBC Recent Labs  Lab 05/12/17 1343 05/12/17 1351 05/13/17 0324 05/14/17 0641  WBC 5.9  --  6.6 5.9  HGB 12.1 12.9 10.7* 11.0*  HCT 37.1 38.0 34.1* 34.3*  PLT 129*  --  109* 101*    Coag's No results for input(s): APTT, INR in the last 168 hours.  Sepsis Markers Recent Labs  Lab 05/13/17 0324 05/14/17 0641  PROCALCITON 0.60 1.41    ABG Recent Labs  Lab 05/12/17 1946  PHART 7.461*  PCO2ART 46.9  PO2ART 66.0*    Liver Enzymes Recent Labs  Lab 05/12/17 1343 05/13/17 0324  AST 68* 52*  ALT 60* 48  ALKPHOS 128* 103  BILITOT 0.7 0.9  ALBUMIN 3.4* 3.2*    Cardiac Enzymes No results for input(s): TROPONINI, PROBNP in the last 168 hours.  Glucose Recent Labs  Lab 05/13/17 1149 05/13/17 1532 05/13/17 2032 05/13/17 2344 05/14/17 0359 05/14/17 0812  GLUCAP 94 88 97 99 111* 132*    Imaging No results found.   STUDIES:  CT head 2/6 >> Area of encephalomalacia within the anterior left frontal lobe underlying calvarial defects, likely burr hole. Area of low-density in the left temporal lobe, age indeterminate infarct.  Atrophy, associated ventriculomegaly. CXR 2/6 >> pulmonary edema, possible aspiration in LLL  CULTURES: RVP 2/6 >> negative Blood 2/6 >>  Sputum 2/6 >>   ANTIBIOTICS: Vanc 2/6 >> 2/7 Zosyn 2/6 >> 2/8  SIGNIFICANT EVENTS: 2/6 Admit to ICU  LINES/TUBES:    DISCUSSION: 57 year old female with ESRD on HD and prior history of CVA and seizure presented with multiple witnessed seizures. Abated with ativan and started on Keppra, Dilantin, and fosphenytoin in ED. Remained significantly lethargic in post-ictal setting. Admitted to ICU for close monitoring.   ASSESSMENT / PLAN:  PULMONARY A: Acute  hypoxic respiratory failure: volume overload vs CAP (Aspiration)? Pulmonary Edema - improved Concern for airway protection  P:   Transition to SDU Supplemental O2 PRN to maintain sats >92% Intermittent CXR Aspiration precautions Pulmonary hygiene - IS, mobilize   CARDIOVASCULAR A:  HTN P:  ICU monitoring Continue PRN hydralazine for SBP >/= 170, DBP >/= 90 Continue to hold home amlodipine  RENAL A:   ESRD on HD P:   Nephrology following, intermittent HD Trend BMP Replace electrolytes as able  GASTROINTESTINAL A:   Transaminitis - improved P:   Intermittent LFT's Will reassess for diet later today 2/8 - if not able to take PO then cortrak tube needed 2/9 AM  HEMATOLOGIC A:   Thrombocytopenia - admit platelets 146 P:  Trend platelets Continue SQ Heparin for DVT prophylaxis Monitor for bleeding  INFECTIOUS A:   ? Aspiration PNA P:   Continue to follow cultures PCT increasing but in the setting of CKD so unable to make definitive interpretation and with no other clinical indications for active infection we will discontinue Zosyn.  ENDOCRINE A:   DM2 P:   SSI  NEUROLOGIC A:   Status epilepticus with history of seizure - improved History of CVA P:   EEG d/c 2/7  Neuro following, appreciated input AED's per neuro  At this time, care will be transferred to [facility]. Please call PCCM if patient requires further critical care or pulmonary management.  FAMILY  - Updates: Sister at bedside 05/14/2016  - Inter-disciplinary family meet or Palliative Care meeting due by:  2/13  Willa FraterJenna Castiglione, PA-S  Canary BrimBrandi Shannell Mikkelsen, NP-C [facility] Pgr: [phone] or if no answer [phone] 05/14/2017, 11:17 AM

## 2017-05-14 NOTE — Progress Notes (Signed)
Report given to Cay Schillingsain Magodo, RN and [place]il[place] ShiMatthew Mahoney, RN. Patient transported to hemodialysis via bed with telemetry and 2L O2 with O2 sats 97% by Emelia LoronKamiya with transport. All aware of patient CODE STATUS CHANGE TO DNR this morning. DNR bracelet placed to right arm per MD order. Ok for patient to transport to hemodialysis without an Charity fundraiserN per Dr. Hyacinth MeekerMiller. VSS. Nursing to continue to monitor.

## 2017-05-14 NOTE — Progress Notes (Signed)
Patient received back from hemodialysis. VSS. Report given to Berniece Paponnie Dupont, RN. Nursing to continue to monitor.

## 2017-05-14 NOTE — Plan of Care (Deleted)
Patient has progressed from NRB to Venti mask and continues to SAT WNL.  Dr Lindie SpruceWyatt put in orders for Xanax for patient's anxiety related to respiratory status. Patient will continue to be NPO except for sips with meds until breathing is stable.  Lasix given with very little urine output.  Dr Lindie SpruceWyatt made aware.  Pain is fairly well controlled with IV dilaudid and Oxycodone. Baird Polinski C 5:56 PM

## 2017-05-14 NOTE — Progress Notes (Signed)
[facility] Progress Note  Subjective: confused, lethargic, on HD  Vitals:   05/14/17 1030 05/14/17 1045 05/14/17 1100 05/14/17 1130  BP: (!) 199/84 (!) 162/69 (!) 186/61 (!) 137/50  Pulse: 90 90 92 98  Resp:      Temp:      TempSrc:      SpO2:      Weight:        Inpatient medications: . heparin  5,000 Units Subcutaneous Q8H  . insulin aspart  0-9 Units Subcutaneous Q4H  . LORazepam  2 mg Intravenous Once  . mouth rinse  15 mL Mouth Rinse BID  . phenytoin (DILANTIN) IV  100 mg Intravenous Q8H  . sodium chloride flush  3 mL Intravenous Q12H   . sodium chloride    . sodium chloride    . levETIRAcetam Stopped (05/14/17 0934)   sodium chloride, sodium chloride, [DISCONTINUED] acetaminophen **OR** acetaminophen, heparin, heparin, hydrALAZINE, lidocaine (PF), lidocaine-prilocaine, [DISCONTINUED] ondansetron **OR** ondansetron (ZOFRAN) IV, pentafluoroprop-tetrafluoroeth  Exam: Small AAF, lethargic, opens eyes and shouts to voice, incoherent though No jvd Chest clear ant / lata RRR tachy no MRG Abd soft ntnd Ext no edema R AVG +bruit   Dialysis: [facility] M[facility] 3.5h   F160   450/ 1.5   38kg   2/2 bath  P4   RUE AVG  Hep 1300 Hectorol 4mcg IV TIW        Impression: 1  Seizures/ status epilepticus - per neuro 2  SOB/ pulm edema/ vol ^ - improved w HD yest 3  ESRD MWF HD, missed HD  4  HTN bp's high 5  Vol still 3Kg + 6  MBD ckd , no chg meds 7  Anemia ckd , Hb good, no need esa at this time 8  Hx CVA  Plan - HD today   Vinson Moselleob Legend Pecore MD [facility] pager [phone]   05/14/2017, 11:54 AM   Recent Labs  Lab 05/12/17 1343 05/12/17 1351 05/13/17 0324 05/14/17 0641  NA 141 138 140 137  K 4.4 4.2 4.7 4.7  CL 95* 96* 94* 93*  CO2 29  --  27 21*  GLUCOSE 160* 157* 153* 132*  BUN 41* 38* 47* 26*  CREATININE 6.48* 6.40* 7.71* 5.35*  CALCIUM 9.8  --  8.9 9.0   Recent Labs  Lab 05/12/17 1343 05/13/17 0324  AST 68* 52*  ALT 60* 48   ALKPHOS 128* 103  BILITOT 0.7 0.9  PROT 7.1 6.6  ALBUMIN 3.4* 3.2*   Recent Labs  Lab 05/12/17 1343 05/12/17 1351 05/13/17 0324 05/14/17 0641  WBC 5.9  --  6.6 5.9  NEUTROABS 4.1  --   --   --   HGB 12.1 12.9 10.7* 11.0*  HCT 37.1 38.0 34.1* 34.3*  MCV 85.5  --  85.9 85.3  PLT 129*  --  109* 101*   Iron/TIBC/Ferritin/ %Sat No results found for: IRON, TIBC, FERRITIN, IRONPCTSAT

## 2017-05-15 ENCOUNTER — Inpatient Hospital Stay (HOSPITAL_COMMUNITY): Payer: Medicaid Other

## 2017-05-15 DIAGNOSIS — I10 Essential (primary) hypertension: Secondary | ICD-10-CM

## 2017-05-15 LAB — BASIC METABOLIC PANEL
ANION GAP: 27 — AB (ref 5–15)
BUN: 21 mg/dL — ABNORMAL HIGH (ref 6–20)
CALCIUM: 9.5 mg/dL (ref 8.9–10.3)
CO2: 18 mmol/L — AB (ref 22–32)
Chloride: 90 mmol/L — ABNORMAL LOW (ref 101–111)
Creatinine, Ser: 4.29 mg/dL — ABNORMAL HIGH (ref 0.44–1.00)
GFR, EST AFRICAN AMERICAN: 12 mL/min — AB (ref >60)
GFR, EST NON AFRICAN AMERICAN: 11 mL/min — AB (ref >60)
Glucose, Bld: 120 mg/dL — ABNORMAL HIGH (ref 65–99)
POTASSIUM: 3.9 mmol/L (ref 3.5–5.1)
Sodium: 135 mmol/L (ref 135–145)

## 2017-05-15 LAB — GLUCOSE, CAPILLARY
GLUCOSE-CAPILLARY: 113 mg/dL — AB (ref 65–99)
GLUCOSE-CAPILLARY: 144 mg/dL — AB (ref 65–99)
GLUCOSE-CAPILLARY: 137 mg/dL — AB (ref 65–99)
Glucose-Capillary: 129 mg/dL — ABNORMAL HIGH (ref 65–99)
Glucose-Capillary: 157 mg/dL — ABNORMAL HIGH (ref 65–99)
Glucose-Capillary: 161 mg/dL — ABNORMAL HIGH (ref 65–99)

## 2017-05-15 LAB — CBC
HEMATOCRIT: 40.1 % (ref 36.0–46.0)
Hemoglobin: 13.3 g/dL (ref 12.0–15.0)
MCH: 27.8 pg (ref 26.0–34.0)
MCHC: 33.2 g/dL (ref 30.0–36.0)
MCV: 83.9 fL (ref 78.0–100.0)
PLATELETS: 134 10*3/uL — AB (ref 150–400)
RBC: 4.78 MIL/uL (ref 3.87–5.11)
RDW: 14.7 % (ref 11.5–15.5)
WBC: 6.2 10*3/uL (ref 4.0–10.5)

## 2017-05-15 LAB — PROCALCITONIN: PROCALCITONIN: 2.13 ng/mL

## 2017-05-15 LAB — PHENYTOIN LEVEL, TOTAL: Phenytoin Lvl: 17.1 ug/mL (ref 10.0–20.0)

## 2017-05-15 MED ORDER — LEVETIRACETAM 500 MG PO TABS
1000.0000 mg | ORAL_TABLET | Freq: Every day | ORAL | Status: DC
  Administered 2017-05-16 – 2017-05-18 (×3): 1000 mg via ORAL
  Filled 2017-05-15 (×3): qty 2

## 2017-05-15 MED ORDER — PHENYTOIN SODIUM EXTENDED 100 MG PO CAPS
100.0000 mg | ORAL_CAPSULE | Freq: Two times a day (BID) | ORAL | Status: DC
  Administered 2017-05-15 – 2017-05-16 (×2): 100 mg via ORAL
  Filled 2017-05-15 (×2): qty 1

## 2017-05-15 MED ORDER — SUCROFERRIC OXYHYDROXIDE 500 MG PO CHEW: 1000.0000 mg | CHEWABLE_TABLET | Freq: Three times a day (TID) | ORAL | Status: DC

## 2017-05-15 MED ORDER — AMLODIPINE BESYLATE 5 MG PO TABS
5.0000 mg | ORAL_TABLET | Freq: Every day | ORAL | Status: DC
  Administered 2017-05-15 – 2017-05-18 (×4): 5 mg via ORAL
  Filled 2017-05-15 (×4): qty 1

## 2017-05-15 MED ORDER — PHENYTOIN SODIUM 50 MG/ML IJ SOLN
100.0000 mg | Freq: Two times a day (BID) | INTRAMUSCULAR | Status: DC
  Administered 2017-05-15: 100 mg via INTRAVENOUS
  Filled 2017-05-15: qty 2

## 2017-05-15 MED ORDER — LEVETIRACETAM 500 MG PO TABS: 1000.0000 mg | ORAL_TABLET | Freq: Two times a day (BID) | ORAL | Status: DC

## 2017-05-15 MED ORDER — INSULIN ASPART 100 UNIT/ML ~~LOC~~ SOLN
0.0000 [IU] | Freq: Three times a day (TID) | SUBCUTANEOUS | Status: DC
  Administered 2017-05-15 – 2017-05-16 (×3): 1 [IU] via SUBCUTANEOUS
  Administered 2017-05-16: 2 [IU] via SUBCUTANEOUS
  Administered 2017-05-16 – 2017-05-17 (×2): 5 [IU] via SUBCUTANEOUS

## 2017-05-15 MED ORDER — FERRIC CITRATE 1 GM 210 MG(FE) PO TABS
840.0000 mg | ORAL_TABLET | Freq: Three times a day (TID) | ORAL | Status: DC
  Administered 2017-05-15 – 2017-05-18 (×8): 840 mg via ORAL
  Filled 2017-05-15 (×11): qty 4

## 2017-05-15 MED ORDER — LEVETIRACETAM 500 MG PO TABS
500.0000 mg | ORAL_TABLET | ORAL | Status: DC
  Administered 2017-05-17: 500 mg via ORAL
  Filled 2017-05-15: qty 1

## 2017-05-15 NOTE — Progress Notes (Signed)
PROGRESS NOTE    Alexis Sims   ZOX:[id number]  DOB: Jan 01, 1961  DOA: 05/12/2017 PCP: Fleet ContrasAvbuere, Edwin, MD   Brief Narrative:  Alexis ChengDora Sims 57 year old female with past medical history as below, which is significant for ESRD on HD, diabetes, CVA, and seizures.  She had has status epilepticus in the past requiring intubation.  On 2/6 while taking the bus to dialysis she had 2 witnessed seizures.  EMS was called and she had one additional seizure in route and another in the emergency department.  She was loaded with Keppra, fosphenytoin, and Dilantin under neurology guidance.  EEG was done in the emergency department showing no current seizures, however she did remain markedly lethargic in the postictal period and was admitted to the ICU for close monitoring.      Subjective: She has no complaints. I spoke with her sister today as well and update both the patient and sister on the plan.  ROS: no complaints of nausea, vomiting, constipation diarrhea, cough, dyspnea or dysuria. No other complaints.   Assessment & Plan:   Principal Problem:   Status epilepticus   - cont Keppra and Dilantin- neurology is following and managing  Active Problems: Acute hypoxemic respiratory failure - suspected to be either aspiration or pulm edema - Zosyn has been discontinued- cont to follow respiratory status    CKD (chronic kidney disease) stage V requiring chronic dialysis  - cont dialysis per nephrology    History of CVA (cerebrovascular accident) - PT eval to assess gait    HTN (hypertension) - on Amlodipine at home     Diabetes mellitus type 2, insulin dependent  - has been NPO and sugars have not been elevated - receives Lantus 15 U at bedtime at home- currently will place on SSI and resume diet  Hyperphosphatemia - cont Auryxia and Velphoro    Transaminitis - noted to have mildly elevated LFTs  on admission which are improving    Thrombocytopenia  - stable - chronic issues dating back at  least to 2017 (per labs in Epic) -  cont to follow   DVT prophylaxis: Heparin Code Status: DNR Family Communication: sister Disposition Plan: SDU monitoring Consultants:   Neurology  PCCM Procedures:   EEG Antimicrobials:  Anti-infectives (From admission, onward)   Start     Dose/Rate Route Frequency Ordered Stop   05/13/17 1200  vancomycin (VANCOCIN) 500 mg in sodium chloride 0.9 % 100 mL IVPB  Status:  Discontinued     500 mg 100 mL/hr over 60 Minutes Intravenous Every Thu (Hemodialysis) 05/13/17 0923 05/13/17 1532   05/13/17 0959  vancomycin (VANCOCIN) 500-5 MG/100ML-% IVPB    Comments:  [place] ShiMahoney, Matthew   : cabinet override      05/13/17 0959 05/13/17 1305   05/13/17 0600  piperacillin-tazobactam (ZOSYN) IVPB 3.375 g  Status:  Discontinued     3.375 g 12.5 mL/hr over 240 Minutes Intravenous Every 12 hours 05/12/17 1746 05/14/17 1115   05/12/17 2115  vancomycin (VANCOCIN) IVPB 1000 mg/200 mL premix     1,000 mg 200 mL/hr over 60 Minutes Intravenous  Once 05/12/17 2114 05/12/17 2258   05/12/17 1800  piperacillin-tazobactam (ZOSYN) IVPB 3.375 g     3.375 g 100 mL/hr over 30 Minutes Intravenous  Once 05/12/17 1746 05/12/17 1902       Objective: Vitals:   05/15/17 0350 05/15/17 0400 05/15/17 0500 05/15/17 0600  BP:  (!) 164/113 (!) 145/82 (!) 154/84  Pulse:  (!) 101 94 (!) 103  Resp:  15 (!) 23 (!) 27  Temp: 97.7 F (36.5 C)     TempSrc: Axillary     SpO2:  97% 96% 95%  Weight:   37.1 kg (81 lb 12.7 oz)     Intake/Output Summary (Last 24 hours) at 05/15/2017 0748 Last data filed at 05/14/2017 1353 Gross per 24 hour  Intake 3 ml  Output 2500 ml  Net -2497 ml   Filed Weights   05/14/17 1010 05/14/17 1350 05/15/17 0500  Weight: 40.9 kg (90 lb 2.7 oz) 38.4 kg (84 lb 10.5 oz) 37.1 kg (81 lb 12.7 oz)    Examination: General exam: Appears comfortable - quite sleepy this AM HEENT: PERRLA, oral mucosa moist, no sclera icterus or thrush Respiratory system: Clear to  auscultation. Respiratory effort normal. Cardiovascular system: S1 & S2 heard, RRR.  No murmurs  Gastrointestinal system: Abdomen soft, non-tender, nondistended. Normal bowel sound. No organomegaly Central nervous system: Alert and oriented. No focal neurological deficits. Extremities: No cyanosis, clubbing or edema Skin: No rashes or ulcers Psychiatry:  Mood & affect appropriate.     Data Reviewed: I have personally reviewed following labs and imaging studies  CBC: Recent Labs  Lab 05/12/17 1343 05/12/17 1351 05/13/17 0324 05/14/17 0641 05/15/17 0542  WBC 5.9  --  6.6 5.9 6.2  NEUTROABS 4.1  --   --   --   --   HGB 12.1 12.9 10.7* 11.0* 13.3  HCT 37.1 38.0 34.1* 34.3* 40.1  MCV 85.5  --  85.9 85.3 83.9  PLT 129*  --  109* 101* 134*   Basic Metabolic Panel: Recent Labs  Lab 05/12/17 1343 05/12/17 1351 05/13/17 0324 05/14/17 0641 05/15/17 0542  NA 141 138 140 137 135  K 4.4 4.2 4.7 4.7 3.9  CL 95* 96* 94* 93* 90*  CO2 29  --  27 21* 18*  GLUCOSE 160* 157* 153* 132* 120*  BUN 41* 38* 47* 26* 21*  CREATININE 6.48* 6.40* 7.71* 5.35* 4.29*  CALCIUM 9.8  --  8.9 9.0 9.5   GFR: CrCl cannot be calculated (Unknown ideal weight.). Liver Function Tests: Recent Labs  Lab 05/12/17 1343 05/13/17 0324  AST 68* 52*  ALT 60* 48  ALKPHOS 128* 103  BILITOT 0.7 0.9  PROT 7.1 6.6  ALBUMIN 3.4* 3.2*   No results for input(s): LIPASE, AMYLASE in the last 168 hours. No results for input(s): AMMONIA in the last 168 hours. Coagulation Profile: No results for input(s): INR, PROTIME in the last 168 hours. Cardiac Enzymes: No results for input(s): CKTOTAL, CKMB, CKMBINDEX, TROPONINI in the last 168 hours. BNP (last 3 results) No results for input(s): PROBNP in the last 8760 hours. HbA1C: Recent Labs    05/12/17 1742  HGBA1C 10.4*   CBG: Recent Labs  Lab 05/14/17 0812 05/14/17 1549 05/14/17 1943 05/14/17 2315 05/15/17 0348  GLUCAP 132* 76 78 84 113*   Lipid  Profile: No results for input(s): CHOL, HDL, LDLCALC, TRIG, CHOLHDL, LDLDIRECT in the last 72 hours. Thyroid Function Tests: Recent Labs    05/12/17 1742  TSH 1.695   Anemia Panel: No results for input(s): VITAMINB12, FOLATE, FERRITIN, TIBC, IRON, RETICCTPCT in the last 72 hours. Urine analysis: No results found for: COLORURINE, APPEARANCEUR, LABSPEC, PHURINE, GLUCOSEU, HGBUR, BILIRUBINUR, KETONESUR, PROTEINUR, UROBILINOGEN, NITRITE, LEUKOCYTESUR Sepsis Labs: @LABRCNTIP (procalcitonin:4,lacticidven:4) ) Recent Results (from the past 240 hour(s))  Culture, blood (Routine X 2) w Reflex to ID Panel     Status: None (Preliminary result)   Collection Time: 05/12/17  8:30 PM  Result Value Ref Range Status   Specimen Description BLOOD LEFT ARM  Final   Special Requests   Final    BOTTLES DRAWN AEROBIC AND ANAEROBIC Blood Culture adequate volume   Culture   Final    NO GROWTH 2 DAYS Performed at [facility], [address], [place], [place] [address]    Report Status PENDING  Incomplete  Culture, blood (Routine X 2) w Reflex to ID Panel     Status: None (Preliminary result)   Collection Time: 05/12/17  8:44 PM  Result Value Ref Range Status   Specimen Description BLOOD LEFT ARM  Final   Special Requests IN PEDIATRIC BOTTLE Blood Culture adequate volume  Final   Culture   Final    NO GROWTH 2 DAYS Performed at [facility], [address], [place], [place] [address]    Report Status PENDING  Incomplete  Respiratory Panel by PCR     Status: None   Collection Time: 05/12/17  9:40 PM  Result Value Ref Range Status   Adenovirus NOT DETECTED NOT DETECTED Final   Coronavirus 229E NOT DETECTED NOT DETECTED Final   Coronavirus HKU1 NOT DETECTED NOT DETECTED Final   Coronavirus NL63 NOT DETECTED NOT DETECTED Final   Coronavirus OC43 NOT DETECTED NOT DETECTED Final   Metapneumovirus NOT DETECTED NOT DETECTED Final   Rhinovirus / Enterovirus NOT DETECTED NOT DETECTED Final    Influenza A NOT DETECTED NOT DETECTED Final   Influenza A H1 NOT DETECTED NOT DETECTED Final   Influenza A H1 2009 NOT DETECTED NOT DETECTED Final   Influenza A H3 NOT DETECTED NOT DETECTED Final   Influenza B NOT DETECTED NOT DETECTED Final   Parainfluenza Virus 1 NOT DETECTED NOT DETECTED Final   Parainfluenza Virus 2 NOT DETECTED NOT DETECTED Final   Parainfluenza Virus 3 NOT DETECTED NOT DETECTED Final   Parainfluenza Virus 4 NOT DETECTED NOT DETECTED Final   Respiratory Syncytial Virus NOT DETECTED NOT DETECTED Final   Bordetella pertussis NOT DETECTED NOT DETECTED Final   Chlamydophila pneumoniae NOT DETECTED NOT DETECTED Final   Mycoplasma pneumoniae NOT DETECTED NOT DETECTED Final    Comment: Performed at [facility], [address], [place], [place] [address]  MRSA PCR Screening     Status: None   Collection Time: 05/12/17  9:40 PM  Result Value Ref Range Status   MRSA by PCR NEGATIVE NEGATIVE Final    Comment:        The GeneXpert MRSA Assay (FDA approved for NASAL specimens only), is one component of a comprehensive MRSA colonization surveillance program. It is not intended to diagnose MRSA infection nor to guide or monitor treatment for MRSA infections. Performed at [facility], [address] [place], [place] [address]          Radiology Studies: Dg Chest Port 1 View  Result Date: 05/14/2017 CLINICAL DATA:  Acute encephalopathy EXAM: PORTABLE CHEST 1 VIEW COMPARISON:  Two days ago FINDINGS: Right more than left hazy lung opacity. There is a streaky retrocardiac density. Chronic cardiomegaly. No Kerley lines or visible effusion. No pneumothorax. Extensive venous stenting in the right upper extremity. IMPRESSION: 1. Hazy opacity of the right more than left chest which could be edema or pneumonitis. There is a retrocardiac streaky opacity favoring atelectasis. 2. Chronic cardiomegaly. Electronically Signed   By: Marnee Spring M.D.   On: 05/14/2017  10:09      Scheduled Meds: .  heparin  5,000 Units Subcutaneous Q8H  . insulin aspart  0-9 Units Subcutaneous Q4H  . LORazepam  2 mg Intravenous Once  . mouth rinse  15 mL Mouth Rinse BID  . phenytoin (DILANTIN) IV  100 mg Intravenous Q12H  . sodium chloride flush  3 mL Intravenous Q12H   Continuous Infusions: . levETIRAcetam Stopped (05/14/17 0934)  . [START ON 05/17/2017] levETIRAcetam       LOS: 3 days    Time spent in minutes: 35    Calvert Cantor, MD [facility] Pager: [email or web address] Password [facility] 05/15/2017, 7:48 AM

## 2017-05-15 NOTE — Progress Notes (Signed)
Pt still non cooperative per RN @ 14:45, 05/15/17. Will re assess later today.

## 2017-05-15 NOTE — Progress Notes (Signed)
Subjective: Continues to improve.   Exam: Vitals:   05/15/17 0700 05/15/17 0800  BP: 136/72 129/75  Pulse: 99 93  Resp: (!) 25 19  Temp:  (!) 97.5 F (36.4 C)  SpO2: 95% 97%   Gen: In bed, NAD Resp: non-labored breathing, no acute distress Abd: soft, nt  Neuro: MS: Awake, much more conversatn, able to identify her sister, think sthat she is at home.  CN: Left pupil reactive , R postsurgical visual fields full Motor: Moves all extremities symmetrically Sensory: Intact light touch  Pertinent Labs: Phenytoin 17  Impression: 57 year old female presenting with status epilepticus refractory to first-line interventions, but resolving with second line(phenytoin).   I suspect that her mental status remains largely due to medications, and her phenytoin level is high which could be contributing.  Her prolonged status epilepticus, however, could be playing a role as well.  Recommendations: 1)  Restart phenytoin at 100mg  BID.  2) Keppra 1 g daily with additional 500 mg after dialysis 3) MRI brain pending.  4) neurology will continue to follow   Ritta SlotMcNeill Damaria Stofko, MD [facility] [phone]  If 7pm- 7am, please page neurology on call as listed in AMION.

## 2017-05-15 NOTE — Progress Notes (Signed)
[facility] Progress Note  Subjective: up in chair, more alert and interactive  Vitals:   05/15/17 0500 05/15/17 0600 05/15/17 0700 05/15/17 0800  BP: (!) 145/82 (!) 154/84 136/72 129/75  Pulse: 94 (!) 103 99 93  Resp: (!) 23 (!) 27 (!) 25 19  Temp:    (!) 97.5 F (36.4 C)  TempSrc:    Axillary  SpO2: 96% 95% 95% 97%  Weight: 37.1 kg (81 lb 12.7 oz)       Inpatient medications: . amLODipine  5 mg Oral Daily  . ferric citrate  840 mg Oral TID WC  . heparin  5,000 Units Subcutaneous Q8H  . insulin aspart  0-9 Units Subcutaneous TID WC  . LORazepam  2 mg Intravenous Once  . mouth rinse  15 mL Mouth Rinse BID  . phenytoin (DILANTIN) IV  100 mg Intravenous Q12H  . sodium chloride flush  3 mL Intravenous Q12H   . levETIRAcetam 1,000 mg (05/15/17 1009)  . [START ON 05/17/2017] levETIRAcetam     [DISCONTINUED] acetaminophen **OR** acetaminophen, hydrALAZINE, [DISCONTINUED] ondansetron **OR** ondansetron (ZOFRAN) IV  Exam: Small AAF, more alert and interactive, still disoriented No jvd Chest poor air movement bilat, no rales RRR tachy no MRG Abd soft ntnd Ext no edema R AVG +bruit   Dialysis: [facility] MWF 3.5h   F160   450/ 1.5   38kg   2/2 bath  P4   RUE AVG  Hep 1300 Hectorol 4mcg IV TIW        Impression: 1  Seizures/ status epilepticus/AMS  - improving, per neuro and primary 2  SOB/ pulm edema/ vol ^ - resolved clinically, 6.5 L off w/ HD x 2 3  ESRD MWF HD, missed HD  4  HTN bp's better 5  Vol at dry wt 6  MBD ckd , 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 7  Anemia ckd , Hb good, no need esa at this time 8  Hx CVA  Plan - HD Monday   Alexis Moselleob Marguarite Markov MD [facility] pager [phone]   05/15/2017, 11:44 AM   Recent Labs  Lab 05/13/17 0324 05/14/17 0641 05/15/17 0542  NA 140 137 135  K 4.7 4.7 3.9  CL 94* 93* 90*  CO2 27 21* 18*  GLUCOSE 153* 132* 120*  BUN 47* 26* 21*  CREATININE 7.71* 5.35* 4.29*  CALCIUM 8.9 9.0 9.5   Recent Labs  Lab 05/12/17 1343  05/13/17 0324  AST 68* 52*  ALT 60* 48  ALKPHOS 128* 103  BILITOT 0.7 0.9  PROT 7.1 6.6  ALBUMIN 3.4* 3.2*   Recent Labs  Lab 05/12/17 1343  05/13/17 0324 05/14/17 0641 05/15/17 0542  WBC 5.9  --  6.6 5.9 6.2  NEUTROABS 4.1  --   --   --   --   HGB 12.1   < > 10.7* 11.0* 13.3  HCT 37.1   < > 34.1* 34.3* 40.1  MCV 85.5  --  85.9 85.3 83.9  PLT 129*  --  109* 101* 134*   < > = values in this interval not displayed.   Iron/TIBC/Ferritin/ %Sat No results found for: IRON, TIBC, FERRITIN, IRONPCTSAT

## 2017-05-15 NOTE — Evaluation (Signed)
Physical Therapy Evaluation Patient Details Name: Alexis ChengDora Sims MRN: [id number] DOB: 1960-05-08 Today's Date: 05/15/2017   History of Present Illness  57 y.o. female admitted for seizures. PMH significant for CVA, chronic kidney disease (on dialysis), DM, abd chrnoic thrombocytopenia.   Clinical Impression  Pt presents with overall decrease in functional mobility secondary to above. Pt demonstrates decreased balance, decreased activity tolerance, and generalized weakness. Pt lethargic throughout session likely due to seizure medication. Bed mobility and transfers min assist for safety. Ambulation ~8 ft mod assist with max assist at times for LOB with no presence of righting reactions. Pt to benefit from continued acute PT to maximize safety, functional mobility, and independence to decrease burden of care prior to d/c.     Follow Up Recommendations SNF    Equipment Recommendations  None recommended by PT    Recommendations for Other Services       Precautions / Restrictions Precautions Precautions: Fall Restrictions Weight Bearing Restrictions: No      Mobility  Bed Mobility Overal bed mobility: Needs Assistance Bed Mobility: Rolling;Sidelying to Sit Rolling: Min guard(for safety) Sidelying to sit: Min assist       General bed mobility comments: min assist for safety due to decreased balance  Transfers Overall transfer level: Needs assistance   Transfers: Sit to/from Stand Sit to Stand: Mod assist         General transfer comment: mod assist for balance/safety due to decreased balance, multiple VCs for scooting to EOB  Ambulation/Gait Ambulation/Gait assistance: Mod assist Ambulation Distance (Feet): 8 Feet Assistive device: Rolling walker (2 wheeled);1 person hand held assist Gait Pattern/deviations: Step-to pattern;Antalgic;Staggering left;Trunk flexed Gait velocity: decreased Gait velocity interpretation: Below normal speed for age/gender General Gait Details:  Mod assist with multiple episodes of LOB requiring max assist to remain upright. Attempted ambulation with RW but pt unable to manage safely. Switched to 1 person HHA for increased stability. Pt with decreased stability and righting reactions staggering to left side.   Stairs            Wheelchair Mobility    Modified Rankin (Stroke Patients Only)       Balance Overall balance assessment: Needs assistance Sitting-balance support: Feet unsupported;Bilateral upper extremity supported Sitting balance-Leahy Scale: Poor Sitting balance - Comments: pt able to maintain near midline for up to ~10 sec but falls to right side with no evidence of righting reactions Postural control: Right lateral lean Standing balance support: Bilateral upper extremity supported;Single extremity supported Standing balance-Leahy Scale: Poor Standing balance comment: staggering to left with no evidence of righting reactions                             Pertinent Vitals/Pain Pain Assessment: Faces Faces Pain Scale: Hurts little more Pain Intervention(s): Monitored during session;Limited activity within patient's tolerance    Home Living Family/patient expects to be discharged to:: Private residence Living Arrangements: Other relatives(sister) Available Help at Discharge: Family Type of Home: House Home Access: Ramped entrance     Home Layout: One level Home Equipment: Emergency planning/management officerhower seat;Walker - 4 wheels      Prior Function Level of Independence: Needs assistance   Gait / Transfers Assistance Needed: supervision to mod I with rollator (per pt's sister)           Hand Dominance   Dominant Hand: Right    Extremity/Trunk Assessment   Upper Extremity Assessment Upper Extremity Assessment: Defer to OT evaluation  Lower Extremity Assessment Lower Extremity Assessment: Generalized weakness;RLE deficits/detail;LLE deficits/detail RLE Deficits / Details: grossly 3/5 or greater RLE  Sensation: (grossly intact) LLE Deficits / Details: grossly 3/5 or greater LLE Sensation: (grossly intact)       Communication   Communication: Other (comment)(speech defecits-possibly prior CVA or Rx induced lethargy)  Cognition Arousal/Alertness: Lethargic;Suspect due to medications Behavior During Therapy: Flat affect;Agitated Overall Cognitive Status: Impaired/Different from baseline Area of Impairment: Orientation;Attention;Following commands;Safety/judgement;Awareness;Problem solving                 Orientation Level: Disoriented to;Time;Situation Current Attention Level: Focused   Following Commands: Follows one step commands inconsistently Safety/Judgement: Decreased awareness of safety;Decreased awareness of deficits Awareness: Intellectual Problem Solving: Requires verbal cues;Slow processing        General Comments General comments (skin integrity, edema, etc.): VSS. Sister present throughout session.     Exercises     Assessment/Plan    PT Assessment Patient needs continued PT services  PT Problem List Decreased activity tolerance;Decreased balance;Decreased mobility;Decreased cognition;Decreased safety awareness;Decreased knowledge of precautions;Decreased strength       PT Treatment Interventions DME instruction;Gait training;Functional mobility training;Therapeutic activities;Therapeutic exercise;Balance training;Neuromuscular re-education;Cognitive remediation;Patient/family education;Manual techniques;Modalities    PT Goals (Current goals can be found in the Care Plan section)  Acute Rehab PT Goals PT Goal Formulation: With patient/family Time For Goal Achievement: 05/29/17 Potential to Achieve Goals: Fair    Frequency Min 3X/week   Barriers to discharge Decreased caregiver support(sister works night shift)      Co-evaluation               AM-PAC PT "6 Clicks" Daily Activity  Outcome Measure Difficulty turning over in bed (including  adjusting bedclothes, sheets and blankets)?: A Little Difficulty moving from lying on back to sitting on the side of the bed? : Unable Difficulty sitting down on and standing up from a chair with arms (e.g., wheelchair, bedside commode, etc,.)?: Unable Help needed moving to and from a bed to chair (including a wheelchair)?: A Lot Help needed walking in hospital room?: A Lot Help needed climbing 3-5 steps with a railing? : Total 6 Click Score: 10    End of Session Equipment Utilized During Treatment: Gait belt Activity Tolerance: Patient limited by lethargy(likely due to seizure medications) Patient left: in chair;with call bell/phone within reach;with chair alarm set Nurse Communication: Mobility status PT Visit Diagnosis: Unsteadiness on feet (R26.81);Other abnormalities of gait and mobility (R26.89);Ataxic gait (R26.0);Difficulty in walking, not elsewhere classified (R26.2)    Time: 4098-1191 PT Time Calculation (min) (ACUTE ONLY): 20 min   Charges:   PT Evaluation $PT Eval Moderate Complexity: 1 Mod     PT G Codes:        Barrie Dunker, SPT  Barrie Dunker 05/15/2017, 10:10 AM

## 2017-05-15 NOTE — Progress Notes (Signed)
Spoke with Dr. Amada JupiterKirkpatrick about changing sz meds to PO. Order to change to PO. Pharmacy assisted dosage.

## 2017-05-16 ENCOUNTER — Inpatient Hospital Stay (HOSPITAL_COMMUNITY): Payer: Medicaid Other

## 2017-05-16 LAB — GLUCOSE, CAPILLARY
GLUCOSE-CAPILLARY: 103 mg/dL — AB (ref 65–99)
GLUCOSE-CAPILLARY: 176 mg/dL — AB (ref 65–99)
GLUCOSE-CAPILLARY: 188 mg/dL — AB (ref 65–99)
GLUCOSE-CAPILLARY: 189 mg/dL — AB (ref 65–99)
Glucose-Capillary: 145 mg/dL — ABNORMAL HIGH (ref 65–99)
Glucose-Capillary: 281 mg/dL — ABNORMAL HIGH (ref 65–99)
Glucose-Capillary: 69 mg/dL (ref 65–99)

## 2017-05-16 LAB — PHENYTOIN LEVEL, TOTAL: Phenytoin Lvl: 17.4 ug/mL (ref 10.0–20.0)

## 2017-05-16 NOTE — Clinical Social Work Note (Signed)
Clinical Social Work Assessment  Patient Details  Name: Alexis Sims MRN: [id number] Date of Birth: 1960-07-14  Date of referral:  05/16/17               Reason for consult:  Facility Placement                Permission sought to share information with:  Facility Industrial/product designerContact Representative Permission granted to share information::  No(pt disoriented spoke with next of kin)  Name::     Lavonna RuaSylvia Hunt  Agency::  SNF  Relationship::  sister  Contact Information:     Housing/Transportation Living arrangements for the past 2 months:  Skilled Building surveyorursing Facility Source of Information:  Siblings Patient Interpreter Needed:  None Criminal Activity/Legal Involvement Pertinent to Current Situation/Hospitalization:  No - Comment as needed Significant Relationships:  Siblings Lives with:  Siblings Do you feel safe going back to the place where you live?  Yes Need for family participation in patient care:  Yes (Comment)(sister does cooking)  Care giving concerns:  Pt lives at home with sister- per sister pt normally able to do ADLs independently other than cooking and she can walk with walker.  At this time pt reportedly much weaker than normal and would be difficult to care for at home.   Social Worker assessment / plan:  CSW spoke with pt sister concerning plan for time of DC.  Sister reports that pt moved in with her about 3 years ago when she moved from [place].  Inquires about if pt is eligible for Medicare benefits- CSW suggested that pt sister go to SSI but that usually once pt is on disability for 2 years with Medicaid they should become eligible for Medicare.  CSW discussed PT recommendation for SNF and explained SNF referral process.  Explained that pt only having medicaid might restrict options for SNF.  Employment status:  Disabled (Comment on whether or not currently receiving Disability) Insurance information[place]  [place] PT Recommendations:  Skilled Nursing Facility Information /  Referral to community resources:  Skilled Nursing Facility  Patient/Family's Response to care:  Pt sister agreeable to SNF- does not think it would be safe for pt to come home in current state.  Patient/Family's Understanding of and Emotional Response to Diagnosis, Current Treatment, and Prognosis:  Pt sister seems to have good understanding of be current condition and restrictions- hopeful she'll be able to return to baseline functioning with therapy.  Emotional Assessment Appearance:  Appears stated age Attitude/Demeanor/Rapport:  Unable to Assess Affect (typically observed):  Unable to Assess Orientation:  Oriented to Self, Oriented to Place, Oriented to  Time Alcohol / Substance use:  Not Applicable Psych involvement (Current and /or in the community):  No (Comment)  Discharge Needs  Concerns to be addressed:  Care Coordination Readmission within the last 30 days:  No Current discharge risk:  Physical Impairment Barriers to Discharge:  Continued Medical Work up   Burna SisUris, Kathi Dohn H, LCSW 05/16/2017, 10:36 AM

## 2017-05-16 NOTE — Progress Notes (Signed)
PROGRESS NOTE    Alexis Sims   ZOX:[id number]  DOB: [birth year]  DOA: 05/12/2017 PCP: Fleet Contras, MD   Brief Narrative:  Alexis Sims 57 year old female with past medical history as below, which is significant for ESRD on HD, diabetes, CVA, and seizures.  She had has status epilepticus in the past requiring intubation.  On 2/6 while taking the bus to dialysis she had 2 witnessed seizures.  EMS was called and she had one additional seizure in route and another in the emergency department.  She was loaded with Keppra, fosphenytoin, and Dilantin under neurology guidance.  EEG was done in the emergency department showing no current seizures, however she did remain markedly lethargic in the postictal period and was admitted to the ICU for close monitoring.      Subjective: She appears confused to time and date today.  ROS: no complaints of nausea, vomiting, constipation diarrhea, cough, dyspnea or dysuria. No other complaints.   Assessment & Plan:   Principal Problem:   Status epilepticus   - cont Keppra and Dilantin- neurology is following and managing- MRI still is pending  Active Problems:  Acute encephalopathy - ? Hospital acquired delirium vs cognitive impairment due to above seizures  Acute hypoxemic respiratory failure - suspected to be either aspiration or pulm edema - Zosyn has been discontinued- continuting to follow respiratory status   CKD (chronic kidney disease) stage V requiring chronic dialysis  - cont dialysis per nephrology   History of CVA (cerebrovascular accident) - PT eval to assess gait   HTN (hypertension) - on Amlodipine at home    Diabetes mellitus type 2, insulin dependent  - has been NPO and sugars have not been elevated - receives Lantus 15 U at bedtime at home- cont on SSI   Hyperphosphatemia - cont Auryxia and Velphoro  Transaminitis - noted to have mildly elevated LFTs  on admission which are improving   Thrombocytopenia  - stable - chronic  issues dating back at least to 2017 (per labs in Epic) -  cont to follow   DVT prophylaxis: Heparin Code Status: DNR Family Communication: sister Disposition Plan: SDU monitoring Consultants:   Neurology  PCCM Procedures:   EEG Antimicrobials:  Anti-infectives (From admission, onward)   Start     Dose/Rate Route Frequency Ordered Stop   05/13/17 1200  vancomycin (VANCOCIN) 500 mg in sodium chloride 0.9 % 100 mL IVPB  Status:  Discontinued     500 mg 100 mL/hr over 60 Minutes Intravenous Every Thu (Hemodialysis) 05/13/17 0923 05/13/17 1532   05/13/17 0959  vancomycin (VANCOCIN) 500-5 MG/100ML-% IVPB    Comments:  [place]   : cabinet override      05/13/17 0959 05/13/17 1305   05/13/17 0600  piperacillin-tazobactam (ZOSYN) IVPB 3.375 g  Status:  Discontinued     3.375 g 12.5 mL/hr over 240 Minutes Intravenous Every 12 hours 05/12/17 1746 05/14/17 1115   05/12/17 2115  vancomycin (VANCOCIN) IVPB 1000 mg/200 mL premix     1,000 mg 200 mL/hr over 60 Minutes Intravenous  Once 05/12/17 2114 05/12/17 2258   05/12/17 1800  piperacillin-tazobactam (ZOSYN) IVPB 3.375 g     3.375 g 100 mL/hr over 30 Minutes Intravenous  Once 05/12/17 1746 05/12/17 1902       Objective: Vitals:   05/16/17 0200 05/16/17 0422 05/16/17 0500 05/16/17 0825  BP:  133/87 134/80 120/61  Pulse:  97 93 90  Resp:  (!) 24 18 16   Temp:  (!) 97.5 F (  36.4 C)  98.1 F (36.7 C)  TempSrc: Oral Oral  Oral  SpO2:   96% 97%  Weight:  35.8 kg (78 lb 14.8 oz)    Height:  4\' 11"  (1.499 m)      Intake/Output Summary (Last 24 hours) at 05/16/2017 1122 Last data filed at 05/16/2017 0900 Gross per 24 hour  Intake 170 ml  Output 0 ml  Net 170 ml   Filed Weights   05/14/17 1350 05/15/17 0500 05/16/17 0422  Weight: 38.4 kg (84 lb 10.5 oz) 37.1 kg (81 lb 12.7 oz) 35.8 kg (78 lb 14.8 oz)    Examination: General exam: Appears comfortable - quite sleepy this AM HEENT: PERRLA, oral mucosa moist, no sclera  icterus or thrush Respiratory system: Clear to auscultation. Respiratory effort normal. Cardiovascular system: S1 & S2 heard, RRR.  No murmurs  Gastrointestinal system: Abdomen soft, non-tender, nondistended. Normal bowel sound. No organomegaly Central nervous system: Alert and oriented. No focal neurological deficits. Extremities: No cyanosis, clubbing or edema Skin: No rashes or ulcers Psychiatry:  Mood & affect appropriate.     Data Reviewed: I have personally reviewed following labs and imaging studies  CBC: Recent Labs  Lab 05/12/17 1343 05/12/17 1351 05/13/17 0324 05/14/17 0641 05/15/17 0542  WBC 5.9  --  6.6 5.9 6.2  NEUTROABS 4.1  --   --   --   --   HGB 12.1 12.9 10.7* 11.0* 13.3  HCT 37.1 38.0 34.1* 34.3* 40.1  MCV 85.5  --  85.9 85.3 83.9  PLT 129*  --  109* 101* 134*   Basic Metabolic Panel: Recent Labs  Lab 05/12/17 1343 05/12/17 1351 05/13/17 0324 05/14/17 0641 05/15/17 0542  NA 141 138 140 137 135  K 4.4 4.2 4.7 4.7 3.9  CL 95* 96* 94* 93* 90*  CO2 29  --  27 21* 18*  GLUCOSE 160* 157* 153* 132* 120*  BUN 41* 38* 47* 26* 21*  CREATININE 6.48* 6.40* 7.71* 5.35* 4.29*  CALCIUM 9.8  --  8.9 9.0 9.5   GFR: Estimated Creatinine Clearance: 8.3 mL/min (A) (by C-G formula based on SCr of 4.29 mg/dL (H)). Liver Function Tests: Recent Labs  Lab 05/12/17 1343 05/13/17 0324  AST 68* 52*  ALT 60* 48  ALKPHOS 128* 103  BILITOT 0.7 0.9  PROT 7.1 6.6  ALBUMIN 3.4* 3.2*   No results for input(s): LIPASE, AMYLASE in the last 168 hours. No results for input(s): AMMONIA in the last 168 hours. Coagulation Profile: No results for input(s): INR, PROTIME in the last 168 hours. Cardiac Enzymes: No results for input(s): CKTOTAL, CKMB, CKMBINDEX, TROPONINI in the last 168 hours. BNP (last 3 results) No results for input(s): PROBNP in the last 8760 hours. HbA1C: No results for input(s): HGBA1C in the last 72 hours. CBG: Recent Labs  Lab 05/15/17 1915  05/15/17 2004 05/16/17 0017 05/16/17 0432 05/16/17 0826  GLUCAP 157* 161* 188* 189* 176*   Lipid Profile: No results for input(s): CHOL, HDL, LDLCALC, TRIG, CHOLHDL, LDLDIRECT in the last 72 hours. Thyroid Function Tests: No results for input(s): TSH, T4TOTAL, FREET4, T3FREE, THYROIDAB in the last 72 hours. Anemia Panel: No results for input(s): VITAMINB12, FOLATE, FERRITIN, TIBC, IRON, RETICCTPCT in the last 72 hours. Urine analysis: No results found for: COLORURINE, APPEARANCEUR, LABSPEC, PHURINE, GLUCOSEU, HGBUR, BILIRUBINUR, KETONESUR, PROTEINUR, UROBILINOGEN, NITRITE, LEUKOCYTESUR Sepsis Labs: @LABRCNTIP (procalcitonin:4,lacticidven:4) ) Recent Results (from the past 240 hour(s))  Culture, blood (Routine X 2) w Reflex to ID Panel  Status: None (Preliminary result)   Collection Time: 05/12/17  8:30 PM  Result Value Ref Range Status   Specimen Description BLOOD LEFT ARM  Final   Special Requests   Final    BOTTLES DRAWN AEROBIC AND ANAEROBIC Blood Culture adequate volume   Culture   Final    NO GROWTH 4 DAYS Performed at [facility], [address], [place], [place] [address]    Report Status PENDING  Incomplete  Culture, blood (Routine X 2) w Reflex to ID Panel     Status: None (Preliminary result)   Collection Time: 05/12/17  8:44 PM  Result Value Ref Range Status   Specimen Description BLOOD LEFT ARM  Final   Special Requests IN PEDIATRIC BOTTLE Blood Culture adequate volume  Final   Culture   Final    NO GROWTH 4 DAYS Performed at [facility], [address], [place], [place] [address]    Report Status PENDING  Incomplete  Respiratory Panel by PCR     Status: None   Collection Time: 05/12/17  9:40 PM  Result Value Ref Range Status   Adenovirus NOT DETECTED NOT DETECTED Final   Coronavirus 229E NOT DETECTED NOT DETECTED Final   Coronavirus HKU1 NOT DETECTED NOT DETECTED Final   Coronavirus NL63 NOT DETECTED NOT DETECTED Final   Coronavirus OC43 NOT  DETECTED NOT DETECTED Final   Metapneumovirus NOT DETECTED NOT DETECTED Final   Rhinovirus / Enterovirus NOT DETECTED NOT DETECTED Final   Influenza A NOT DETECTED NOT DETECTED Final   Influenza A H1 NOT DETECTED NOT DETECTED Final   Influenza A H1 2009 NOT DETECTED NOT DETECTED Final   Influenza A H3 NOT DETECTED NOT DETECTED Final   Influenza B NOT DETECTED NOT DETECTED Final   Parainfluenza Virus 1 NOT DETECTED NOT DETECTED Final   Parainfluenza Virus 2 NOT DETECTED NOT DETECTED Final   Parainfluenza Virus 3 NOT DETECTED NOT DETECTED Final   Parainfluenza Virus 4 NOT DETECTED NOT DETECTED Final   Respiratory Syncytial Virus NOT DETECTED NOT DETECTED Final   Bordetella pertussis NOT DETECTED NOT DETECTED Final   Chlamydophila pneumoniae NOT DETECTED NOT DETECTED Final   Mycoplasma pneumoniae NOT DETECTED NOT DETECTED Final    Comment: Performed at [facility], [address], [place], [place] [address]  MRSA PCR Screening     Status: None   Collection Time: 05/12/17  9:40 PM  Result Value Ref Range Status   MRSA by PCR NEGATIVE NEGATIVE Final    Comment:        The GeneXpert MRSA Assay (FDA approved for NASAL specimens only), is one component of a comprehensive MRSA colonization surveillance program. It is not intended to diagnose MRSA infection nor to guide or monitor treatment for MRSA infections. Performed at [facility], [address] [place], [place] [address]          Radiology Studies: No results found.    Scheduled Meds: . amLODipine  5 mg Oral Daily  . ferric citrate  840 mg Oral TID WC  . heparin  5,000 Units Subcutaneous Q8H  . insulin aspart  0-9 Units Subcutaneous TID WC  . levETIRAcetam  1,000 mg Oral Daily   And  . [START ON 05/17/2017] levETIRAcetam  500 mg Oral Q M,W,F-1800  . LORazepam  2 mg Intravenous Once  . mouth rinse  15 mL Mouth Rinse BID  . sodium chloride flush  3 mL Intravenous Q12H  Continuous Infusions:    LOS:  4 days    Time spent in minutes: 35    Calvert Cantor, MD [facility] Pager: [email or web address] Password [email or web address] 05/16/2017, 11:22 AM

## 2017-05-16 NOTE — Progress Notes (Signed)
05/16/17 @ 1733, pt still non cooperative, spitting at staff when awake. To call back this evening.

## 2017-05-16 NOTE — Progress Notes (Signed)
Subjective: No significant changes.   Exam: Vitals:   05/16/17 0500 05/16/17 0825  BP: 134/80 120/61  Pulse: 93 90  Resp: 18 16  Temp:  98.1 F (36.7 C)  SpO2: 96% 97%   Gen: In bed, NAD Resp: non-labored breathing, no acute distress Abd: soft, nt  Neuro: MS: Awake, much more conversant, not sure where she is.  CN: Left pupil reactive , R postsurgical visual fields full Motor: Moves all extremities symmetrically Sensory: Intact light touch  Impression: 57 year old female presenting with status epilepticus refractory to first-line interventions, but resolving with second line(phenytoin).   I suspect that her mental status remains largely due to medications, and her phenytoin level is high which could be contributing.  Her prolonged status epilepticus, however, could be playing a role as well.  Though she required two medications to stop her status epilepticus, I am not sure she will need a multidrug regimen long term. I would favor stopping dilantin at this time.   Recommendations: 1) stop dilantin 2) Keppra 1 g daily with additional 500 mg after dialysis 3) MRI brain pending.  4) neurology will continue to follow   Ritta SlotMcNeill Batya Citron, MD [facility] [phone]  If 7pm- 7am, please page neurology on call as listed in AMION.

## 2017-05-16 NOTE — Evaluation (Signed)
Occupational Therapy Evaluation Patient Details Name: Alexis ChengDora Sims MRN: [id number] DOB: 16-May-1960 Today's Date: 05/16/2017    History of Present Illness 57 y.o. female admitted for seizures. PMH significant for CVA, chronic kidney disease (on dialysis), DM, abd chrnoic thrombocytopenia.    Clinical Impression   PTA, pt was living with her sister. Pt poor historian and not reporting on PLOF. Pt requiring Max A for ADLs at bed level and declining OOB activity. Pt demonstrating poor functional performance and cognition. Pt requiring Max cues to participate in ADLs. Pt would benefit from further acute OT to increase occupational performance and participation. Recommend dc to SNF for further OT to increase safety and independence with ADL and functional mobility.     Follow Up Recommendations  SNF;Supervision/Assistance - 24 hour    Equipment Recommendations  Other (comment)(TBA)    Recommendations for Other Services PT consult     Precautions / Restrictions Precautions Precautions: Fall Restrictions Weight Bearing Restrictions: No      Mobility Bed Mobility Overal bed mobility: Needs Assistance Bed Mobility: Rolling Rolling: Supervision         General bed mobility comments: rolling to left and right without difficulty  Transfers                 General transfer comment: Pt declining any OOB activity    Balance                                           ADL either performed or assessed with clinical judgement   ADL Overall ADL's : Needs assistance/impaired   Eating/Feeding Details (indicate cue type and reason): RN reports that pt only eating with Max cues and has poor lip closure with food follow out of her mouth throughout self feeding   Grooming Details (indicate cue type and reason): Attempted to have pt participate in grooming at bedlevel. pt declinding stating "why you bothering me?"         Upper Body Dressing : Maximal  assistance;Bed level                     General ADL Comments: Limited evaluation due to decreased pt participation. Pt agreeable to change gown at bed level with Max cues.      Vision Baseline Vision/History: (Unsure)       Perception     Praxis      Pertinent Vitals/Pain Pain Assessment: Faces Faces Pain Scale: Hurts little more Pain Location: Generalized Pain Intervention(s): Limited activity within patient's tolerance;Monitored during session;Repositioned     Hand Dominance Right   Extremity/Trunk Assessment Upper Extremity Assessment Upper Extremity Assessment: Difficult to assess due to impaired cognition   Lower Extremity Assessment Lower Extremity Assessment: Defer to PT evaluation RLE Deficits / Details: grossly 3/5 or greater RLE Sensation: (grossly intact) LLE Deficits / Details: grossly 3/5 or greater LLE Sensation: (grossly intact)   Cervical / Trunk Assessment Cervical / Trunk Assessment: Normal   Communication Communication Communication: Other (comment)(speech defecits-possibly prior CVA or Rx induced lethargy)   Cognition Arousal/Alertness: Lethargic;Suspect due to medications Behavior During Therapy: Flat affect;Agitated Overall Cognitive Status: Impaired/Different from baseline Area of Impairment: Orientation;Attention;Following commands;Safety/judgement;Awareness;Problem solving                 Orientation Level: Disoriented to;Time;Situation Current Attention Level: Focused   Following Commands: Follows one step commands inconsistently Safety/Judgement: Decreased awareness  of safety;Decreased awareness of deficits Awareness: Intellectual Problem Solving: Requires verbal cues;Slow processing;Difficulty sequencing;Decreased initiation;Requires tactile cues General Comments: Poor following commands and required Max cues for particiaption in ADLs   General Comments  VSS    Exercises     Shoulder Instructions      Home  Living Family/patient expects to be discharged to:: Private residence Living Arrangements: Other relatives;Non-relatives/Friends Available Help at Discharge: Family Type of Home: House Home Access: Ramped entrance     Home Layout: One level         Bathroom Toilet: Standard Bathroom Accessibility: Yes   Home Equipment: Emergency planning/management officer - 4 wheels   Additional Comments: Per chart review, pt lives with her sister and above home information. Pt with limited particiaption and only reports that she lives with her sister      Prior Functioning/Environment Level of Independence: Needs assistance  Gait / Transfers Assistance Needed: supervision to mod I with rollator (per chart review) ADL's / Homemaking Assistance Needed: Pt reports that her sisters performs all IADLs.            OT Problem List: Decreased strength;Decreased range of motion;Decreased activity tolerance;Impaired balance (sitting and/or standing);Decreased cognition;Decreased knowledge of use of DME or AE;Decreased safety awareness;Impaired UE functional use      OT Treatment/Interventions: Self-care/ADL training;Therapeutic exercise;Energy conservation;DME and/or AE instruction;Therapeutic activities;Patient/family education    OT Goals(Current goals can be found in the care plan section) Acute Rehab OT Goals Patient Stated Goal: Unstated OT Goal Formulation: With patient Time For Goal Achievement: 05/30/17 Potential to Achieve Goals: Good ADL Goals Pt Will Perform Grooming: with set-up;with supervision;sitting Pt Will Perform Upper Body Dressing: with set-up;with supervision;sitting Pt Will Perform Lower Body Dressing: with min guard assist;sit to/from stand Pt Will Transfer to Toilet: with min guard assist;stand pivot transfer;bedside commode Additional ADL Goal #1: Pt will demonstrating focused attention on ADL for 5 minutes with Mod cues  OT Frequency: Min 2X/week   Barriers to D/C:             Co-evaluation              AM-PAC PT "6 Clicks" Daily Activity     Outcome Measure Help from another person eating meals?: A Lot Help from another person taking care of personal grooming?: A Lot Help from another person toileting, which includes using toliet, bedpan, or urinal?: A Lot Help from another person bathing (including washing, rinsing, drying)?: A Lot Help from another person to put on and taking off regular upper body clothing?: A Lot Help from another person to put on and taking off regular lower body clothing?: A Lot 6 Click Score: 12   End of Session Nurse Communication: Mobility status  Activity Tolerance: Patient limited by lethargy;Patient limited by fatigue;Treatment limited secondary to agitation Patient left: in bed;with call bell/phone within reach  OT Visit Diagnosis: Unsteadiness on feet (R26.81);Other abnormalities of gait and mobility (R26.89);Muscle weakness (generalized) (M62.81);Other symptoms and signs involving cognitive function                Time: 1610-9604 OT Time Calculation (min): 13 min Charges:  OT General Charges $OT Visit: 1 Visit OT Evaluation $OT Eval Moderate Complexity: 1 Mod G-Codes:     Alexis Sims MSOT, OTR/L Acute Rehab Pager: [phone] Office: [phone]  Alexis Sims Alexis Sims 05/16/2017, 5:13 PM

## 2017-05-16 NOTE — Progress Notes (Signed)
[facility] Progress Note  Subjective: lying in bed , awake, confused, responsive  Vitals:   05/16/17 0200 05/16/17 0422 05/16/17 0500 05/16/17 0825  BP:  133/87 134/80 120/61  Pulse:  97 93 90  Resp:  (!) 24 18 16   Temp:  (!) 97.5 F (36.4 C)  98.1 F (36.7 C)  TempSrc: Oral Oral  Oral  SpO2:   96% 97%  Weight:  35.8 kg (78 lb 14.8 oz)    Height:  4\' 11"  (1.499 m)      Inpatient medications: . amLODipine  5 mg Oral Daily  . ferric citrate  840 mg Oral TID WC  . heparin  5,000 Units Subcutaneous Q8H  . insulin aspart  0-9 Units Subcutaneous TID WC  . levETIRAcetam  1,000 mg Oral Daily   And  . [START ON 05/17/2017] levETIRAcetam  500 mg Oral Q M,W,F-1800  . LORazepam  2 mg Intravenous Once  . mouth rinse  15 mL Mouth Rinse BID  . sodium chloride flush  3 mL Intravenous Q12H    [DISCONTINUED] acetaminophen **OR** acetaminophen, hydrALAZINE, [DISCONTINUED] ondansetron **OR** ondansetron (ZOFRAN) IV  Exam: Small AAF, alert, confused No jvd Chest clear bilat RRR tachy no MRG Abd soft ntnd Ext no edema R AVG +bruit   Dialysis: [facility] MWF 3.5h   F160   450/ 1.5   38kg   2/2 bath  P4   RUE AVG  Hep 1300 Hectorol 4mcg IV TIW        Impression: 1  Seizures/ status epilepticus - still confused, per neuro will dc dilantin and continue Keppra 2  SOB/ vol overload- resolved, new dry wt at dc 3  ESRD MWF HD, missed HD  4  HTN bp's good 5  Vol under dry, lower at dc 6  MBD ckd , 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 7  Anemia ckd , Hb good, no need esa at this time 8  Hx CVA  Plan - HD Monday, no UF   Vinson Moselleob Velvia Mehrer MD [facility] pager [phone]   05/16/2017, 10:55 AM   Recent Labs  Lab 05/13/17 0324 05/14/17 0641 05/15/17 0542  NA 140 137 135  K 4.7 4.7 3.9  CL 94* 93* 90*  CO2 27 21* 18*  GLUCOSE 153* 132* 120*  BUN 47* 26* 21*  CREATININE 7.71* 5.35* 4.29*  CALCIUM 8.9 9.0 9.5   Recent Labs  Lab 05/12/17 1343 05/13/17 0324  AST 68* 52*  ALT  60* 48  ALKPHOS 128* 103  BILITOT 0.7 0.9  PROT 7.1 6.6  ALBUMIN 3.4* 3.2*   Recent Labs  Lab 05/12/17 1343  05/13/17 0324 05/14/17 0641 05/15/17 0542  WBC 5.9  --  6.6 5.9 6.2  NEUTROABS 4.1  --   --   --   --   HGB 12.1   < > 10.7* 11.0* 13.3  HCT 37.1   < > 34.1* 34.3* 40.1  MCV 85.5  --  85.9 85.3 83.9  PLT 129*  --  109* 101* 134*   < > = values in this interval not displayed.   Iron/TIBC/Ferritin/ %Sat No results found for: IRON, TIBC, FERRITIN, IRONPCTSAT

## 2017-05-16 NOTE — NC FL2 (Signed)
[place] MEDICAID FL2 LEVEL OF CARE SCREENING TOOL     IDENTIFICATION  Patient Name: Alexis Sims Birthdate: 1961-01-13 Sex: female Admission Date (Current Location): 05/12/2017  [facility] and [place] Number:  Producer, television/film/video and Address:  [facility] [place], [address] [place], [place] [address]      Provider Number: [id number]  Attending Physician Name and Address:  Calvert Cantor, MD  Relative Name and Phone Number:       Current Level of Care: Hospital Recommended Level of Care: Skilled Nursing Facility Prior Approval Number:    Date Approved/Denied:   PASRR Number: [id number]  Discharge Plan: SNF    Current Diagnoses: Patient Active Problem List   Diagnosis Date Noted  . Status epilepticus (HCC) 05/12/2017  . CKD (chronic kidney disease) stage V requiring chronic dialysis (HCC) 05/12/2017  . History of CVA (cerebrovascular accident) 05/12/2017  . Acute hyperglycemia 05/12/2017  . HTN (hypertension) 05/12/2017  . Gait disturbance, post-stroke 05/12/2017  . Diabetes mellitus type 2, insulin dependent (HCC) 05/12/2017  . Transaminitis 05/12/2017  . Thrombocytopenia (HCC) 05/12/2017  . Sinus tachycardia 05/12/2017  . Hyperkalemia 06/01/2015    Orientation RESPIRATION BLADDER Height & Weight     Self, Time, Place  Normal Continent Weight: 78 lb 14.8 oz (35.8 kg) Height:  4\' 11"  (149.9 cm)  BEHAVIORAL SYMPTOMS/MOOD NEUROLOGICAL BOWEL NUTRITION STATUS      Continent Diet(see DC summary)  AMBULATORY STATUS COMMUNICATION OF NEEDS Skin   Limited Assist Verbally Normal                       Personal Care Assistance Level of Assistance  Bathing, Dressing Bathing Assistance: Limited assistance   Dressing Assistance: Limited assistance     Functional Limitations Info             SPECIAL CARE FACTORS FREQUENCY  PT (By licensed PT), OT (By licensed OT)     PT Frequency: 5/wk OT Frequency: 5/wk            Contractures       Additional Factors Info  Code Status, Allergies, Psychotropic, Insulin Sliding Scale Code Status Info: DNR Allergies Info: NKA Psychotropic Info: ativan Insulin Sliding Scale Info: 3/day       Current Medications (05/16/2017):  This is the current hospital active medication list Current Facility-Administered Medications  Medication Dose Route Frequency Provider Last Rate Last Dose  . acetaminophen (TYLENOL) suppository 650 mg  650 mg Rectal Q6H PRN Russella Dar, NP      . amLODipine (NORVASC) tablet 5 mg  5 mg Oral Daily Calvert Cantor, MD   5 mg at 05/16/17 0905  . ferric citrate (AURYXIA) tablet 840 mg  840 mg Oral TID WC Calvert Cantor, MD   840 mg at 05/16/17 0904  . heparin injection 5,000 Units  5,000 Units Subcutaneous Q8H Russella Dar, NP   5,000 Units at 05/16/17 0500  . hydrALAZINE (APRESOLINE) injection 10 mg  10 mg Intravenous Q6H PRN Ollis, Brandi L, NP      . insulin aspart (novoLOG) injection 0-9 Units  0-9 Units Subcutaneous TID WC Calvert Cantor, MD   2 Units at 05/16/17 0907  . levETIRAcetam (KEPPRA) tablet 1,000 mg  1,000 mg Oral Daily Scarlett Presto, [facility]       And  . Melene Muller ON 05/17/2017] levETIRAcetam (KEPPRA) tablet 500 mg  500 mg Oral Q M,W,F-1800 Scarlett Presto, [facility]      .  LORazepam (ATIVAN) injection 2 mg  2 mg Intravenous Once Steward RosSmith, David R, PA-C   Stopped at 05/12/17 1730  . MEDLINE mouth rinse  15 mL Mouth Rinse BID Ollis, Brandi L, NP   15 mL at 05/16/17 0908  . ondansetron (ZOFRAN) injection 4 mg  4 mg Intravenous Q6H PRN Junious SilkEllis, Allison L, NP      . sodium chloride flush (NS) 0.9 % injection 3 mL  3 mL Intravenous Q12H Russella DarEllis, Allison L, NP   3 mL at 05/16/17 [id number]     Discharge Medications: Please see discharge summary for a list of discharge medications.  Relevant Imaging Results:  Relevant Lab Results:   Additional Information SS#: [id number] Dialysis M[facility] at [facility]- transport by [facility]  Mia Winthrop, Wyn QuakerJenna H, LCSW

## 2017-05-17 ENCOUNTER — Inpatient Hospital Stay (HOSPITAL_COMMUNITY): Payer: Medicaid Other

## 2017-05-17 LAB — CULTURE, BLOOD (ROUTINE X 2)
CULTURE: NO GROWTH
Culture: NO GROWTH
Special Requests: ADEQUATE
Special Requests: ADEQUATE

## 2017-05-17 LAB — CBC
HCT: 35.3 % — ABNORMAL LOW (ref 36.0–46.0)
Hemoglobin: 11.5 g/dL — ABNORMAL LOW (ref 12.0–15.0)
MCH: 27.1 pg (ref 26.0–34.0)
MCHC: 32.6 g/dL (ref 30.0–36.0)
MCV: 83.3 fL (ref 78.0–100.0)
PLATELETS: 139 10*3/uL — AB (ref 150–400)
RBC: 4.24 MIL/uL (ref 3.87–5.11)
RDW: 14.4 % (ref 11.5–15.5)
WBC: 6.1 10*3/uL (ref 4.0–10.5)

## 2017-05-17 LAB — BASIC METABOLIC PANEL
Anion gap: 24 — ABNORMAL HIGH (ref 5–15)
BUN: 58 mg/dL — AB (ref 6–20)
CALCIUM: 9.5 mg/dL (ref 8.9–10.3)
CHLORIDE: 91 mmol/L — AB (ref 101–111)
CO2: 21 mmol/L — ABNORMAL LOW (ref 22–32)
CREATININE: 8.8 mg/dL — AB (ref 0.44–1.00)
GFR calc non Af Amer: 4 mL/min — ABNORMAL LOW (ref >60)
GFR, EST AFRICAN AMERICAN: 5 mL/min — AB (ref >60)
Glucose, Bld: 287 mg/dL — ABNORMAL HIGH (ref 65–99)
Potassium: 4.8 mmol/L (ref 3.5–5.1)
SODIUM: 136 mmol/L (ref 135–145)

## 2017-05-17 LAB — GLUCOSE, CAPILLARY
GLUCOSE-CAPILLARY: 156 mg/dL — AB (ref 65–99)
GLUCOSE-CAPILLARY: 250 mg/dL — AB (ref 65–99)
Glucose-Capillary: 252 mg/dL — ABNORMAL HIGH (ref 65–99)
Glucose-Capillary: 277 mg/dL — ABNORMAL HIGH (ref 65–99)

## 2017-05-17 MED ORDER — INSULIN GLARGINE 100 UNIT/ML ~~LOC~~ SOLN
10.0000 [IU] | Freq: Every day | SUBCUTANEOUS | Status: DC
  Administered 2017-05-17: 10 [IU] via SUBCUTANEOUS
  Filled 2017-05-17 (×2): qty 0.1

## 2017-05-17 MED ORDER — HEPARIN SODIUM (PORCINE) 1000 UNIT/ML DIALYSIS: 1300.0000 [IU] | Freq: Once | INTRAMUSCULAR | Status: DC

## 2017-05-17 MED ORDER — INSULIN ASPART 100 UNIT/ML ~~LOC~~ SOLN
0.0000 [IU] | Freq: Three times a day (TID) | SUBCUTANEOUS | Status: DC
  Administered 2017-05-18: 2 [IU] via SUBCUTANEOUS
  Administered 2017-05-18: 1 [IU] via SUBCUTANEOUS

## 2017-05-17 MED ORDER — INSULIN ASPART 100 UNIT/ML ~~LOC~~ SOLN
0.0000 [IU] | Freq: Every day | SUBCUTANEOUS | Status: DC
  Administered 2017-05-17: 5 [IU] via SUBCUTANEOUS

## 2017-05-17 MED ORDER — INSULIN GLARGINE 100 UNIT/ML SOLOSTAR PEN: 10.0000 [IU] | PEN_INJECTOR | Freq: Every day | SUBCUTANEOUS | Status: DC

## 2017-05-17 MED ORDER — INSULIN GLARGINE 100 UNIT/ML SOLOSTAR PEN: 15.0000 [IU] | PEN_INJECTOR | Freq: Every day | SUBCUTANEOUS | Status: DC

## 2017-05-17 NOTE — Progress Notes (Signed)
PT Cancellation Note  Patient Details Name: Alexis ChengDora Dyck MRN: [id number] DOB: 04-07-1960   Cancelled Treatment:    Reason Eval/Treat Not Completed: Patient at procedure or test/unavailable(Pt is in HD.  Will return as able. )   Amadeo Garnetawn F Zarria Towell 05/17/2017, 11:38 AM  Eber Jonesawn Nhi Butrum,PT Acute Rehabilitation [phone] (pager)

## 2017-05-17 NOTE — Progress Notes (Signed)
Physical Therapy Treatment Patient Details Name: Alexis Sims MRN: [id number] DOB: 10/11/1960 Today's Date: 05/17/2017    History of Present Illness 57 y.o. female admitted for seizures. PMH significant for CVA, chronic kidney disease (on dialysis), DM, abd chrnoic thrombocytopenia.     PT Comments    Pt admitted with above diagnosis. Pt currently with functional limitations due to balance and endurance deficits. Pt was able to ambulate but only with mod to max assist of 2 people assisting pt with chair follow.  ATaxic gait with a lot of assist.  Will follow acutely.  Pt will benefit from skilled PT to increase their independence and safety with mobility to allow discharge to the venue listed below.     Follow Up Recommendations  SNF     Equipment Recommendations  None recommended by PT    Recommendations for Other Services       Precautions / Restrictions Precautions Precautions: Fall Restrictions Weight Bearing Restrictions: No    Mobility  Bed Mobility Overal bed mobility: Needs Assistance Bed Mobility: Rolling Rolling: Supervision Sidelying to sit: Min assist;Mod assist;+2 for physical assistance       General bed mobility comments: rolling to left and right without difficulty. Mod assist to come to eOB due to poor trunk control.    Transfers Overall transfer level: Needs assistance Equipment used: Rolling walker (2 wheeled) Transfers: Sit to/from Stand Sit to Stand: Mod assist         General transfer comment: Pt needed assist to stand to power up and once standing very unsteady.  Ataxic trunk.   Ambulation/Gait Ambulation/Gait assistance: Mod assist;Max assist;+2 physical assistance Ambulation Distance (Feet): 40 Feet(20 feet x 2) Assistive device: Rolling walker (2 wheeled);1 person hand held assist Gait Pattern/deviations: Step-to pattern;Antalgic;Staggering left;Trunk flexed;Ataxic;Scissoring Gait velocity: decreased Gait velocity interpretation: Below  normal speed for age/gender General Gait Details: Mod assist with multiple episodes of LOB requiring max assist to remain upright. Had to assist with managing RW safely. Pt at times scissoring and ataxic gait overall. Pt with decreased stability and righting reactions staggering to left side.    Stairs            Wheelchair Mobility    Modified Rankin (Stroke Patients Only)       Balance Overall balance assessment: Needs assistance Sitting-balance support: Feet unsupported;Bilateral upper extremity supported Sitting balance-Leahy Scale: Poor Sitting balance - Comments: pt able to maintain near midline for up to ~10 sec but falls to left and right side with no evidence of righting reactions Postural control: Right lateral lean Standing balance support: Bilateral upper extremity supported;During functional activity Standing balance-Leahy Scale: Poor Standing balance comment: staggering to left and right with no evidence of righting reactions                            Cognition Arousal/Alertness: Awake/alert Behavior During Therapy: Flat affect Overall Cognitive Status: Impaired/Different from baseline Area of Impairment: Orientation;Attention;Following commands;Safety/judgement;Awareness;Problem solving                 Orientation Level: Disoriented to;Time;Situation;Place Current Attention Level: Focused   Following Commands: Follows one step commands inconsistently Safety/Judgement: Decreased awareness of safety;Decreased awareness of deficits Awareness: Intellectual Problem Solving: Requires verbal cues;Slow processing;Difficulty sequencing;Decreased initiation;Requires tactile cues General Comments: Poor following commands      Exercises      General Comments        Pertinent Vitals/Pain Pain Assessment: No/denies pain   VSS  Home Living                      Prior Function            PT Goals (current goals can now be found in  the care plan section) Progress towards PT goals: Progressing toward goals    Frequency    Min 2X/week      PT Plan Current plan remains appropriate;Frequency needs to be updated    Co-evaluation              AM-PAC PT "6 Clicks" Daily Activity  Outcome Measure  Difficulty turning over in bed (including adjusting bedclothes, sheets and blankets)?: A Little Difficulty moving from lying on back to sitting on the side of the bed? : Unable Difficulty sitting down on and standing up from a chair with arms (e.g., wheelchair, bedside commode, etc,.)?: Unable Help needed moving to and from a bed to chair (including a wheelchair)?: A Lot Help needed walking in hospital room?: A Lot Help needed climbing 3-5 steps with a railing? : Total 6 Click Score: 10    End of Session Equipment Utilized During Treatment: Gait belt Activity Tolerance: Patient limited by fatigue Patient left: in bed;with bed alarm set;with call bell/phone within reach Nurse Communication: Mobility status PT Visit Diagnosis: Unsteadiness on feet (R26.81);Other abnormalities of gait and mobility (R26.89);Ataxic gait (R26.0);Difficulty in walking, not elsewhere classified (R26.2)     Time: [id number] PT Time Calculation (min) (ACUTE ONLY): 15 min  Charges:  $Gait Training: 8-22 mins                    G Codes:       Rozell Kettlewell,PT Acute Rehabilitation [phone])    Berline Lopesawn F Jeven Topper 05/17/2017, 4:10 PM

## 2017-05-17 NOTE — Care Management Note (Signed)
Case Management Note  Patient Details  Name: Alexis Sims MRN: [id number] Date of Birth: April 17, 1960  Subjective/Objective:  From home with sister, presents with Status epilepticus, acute encephalopathy, acute hypoxemic resp failure, ckd, hx of cva, htn, dm2, hyperphosphatemia, transaminitis, thrombocytopenia.  Sister works at [employer] and would like for her to be able to go there at [employer]. CSW following.                   Action/Plan: PLan for SNF when medically ready.  Expected Discharge Date:                  Expected Discharge Plan:  Skilled Nursing Facility  In-House Referral:  Clinical Social Work  Discharge planning Services  CM Consult  Post Acute Care Choice:    Choice offered to:     DME Arranged:    DME Agency:     HH Arranged:    HH Agency:     Status of Service:  In process, will continue to follow  If discussed at Long Length of Stay Meetings, dates discussed:    Additional Comments:  Leone Havenaylor, Dontavious Emily Clinton, RN 05/17/2017, 4:53 PM

## 2017-05-17 NOTE — Progress Notes (Signed)
PROGRESS NOTE    Alexis Sims   XBJ:[id number]  DOB: May 30, 1960  DOA: 05/12/2017 PCP: Fleet Contras, MD   Brief Narrative:  Alexis Sims 57 year old female with past medical history as below, which is significant for ESRD on HD, diabetes, CVA, and seizures.  She had has status epilepticus in the past requiring intubation.  On 2/6 while taking the bus to dialysis she had 2 witnessed seizures.  EMS was called and she had one additional seizure in route and another in the emergency department.  She was loaded with Keppra, fosphenytoin, and Dilantin under neurology guidance.  EEG was done in the emergency department showing no current seizures, however she did remain markedly lethargic in the postictal period and was admitted to the ICU for close monitoring.      Subjective: Evaluated while on dialysis.  ROS: no complaints of nausea, vomiting, constipation diarrhea, cough, dyspnea or dysuria. No other complaints.   Assessment & Plan:   Principal Problem:   Status epilepticus   - cont Keppra and Dilantin- neurology is following and managing- MRI still is pending  Active Problems:  Acute encephalopathy - ? Hospital acquired delirium vs cognitive impairment due to above seizures  Acute hypoxemic respiratory failure - suspected to be either aspiration or pulm edema - Zosyn has been discontinued- continuting to follow respiratory status   CKD (chronic kidney disease) stage V requiring chronic dialysis  - cont dialysis per nephrology   History of CVA (cerebrovascular accident) - PT eval to assess gait   HTN (hypertension) - on Amlodipine at home    Diabetes mellitus type 2, insulin dependent  - receives Lantus 15 U at bedtime at home- will resume at 10 U today and cont SSI  Hyperphosphatemia - cont Auryxia and Velphoro  Transaminitis - noted to have mildly elevated LFTs  on admission which are improving   Thrombocytopenia  - stable - chronic issues dating back at least to 2017  (per labs in Epic) -  cont to follow   DVT prophylaxis: Heparin Code Status: DNR Family Communication: sister Disposition Plan: SDU monitoring Consultants:   Neurology  PCCM Procedures:   EEG Antimicrobials:  Anti-infectives (From admission, onward)   Start     Dose/Rate Route Frequency Ordered Stop   05/13/17 1200  vancomycin (VANCOCIN) 500 mg in sodium chloride 0.9 % 100 mL IVPB  Status:  Discontinued     500 mg 100 mL/hr over 60 Minutes Intravenous Every Thu (Hemodialysis) 05/13/17 0923 05/13/17 1532   05/13/17 0959  vancomycin (VANCOCIN) 500-5 MG/100ML-% IVPB    Comments:  [place]   : cabinet override      05/13/17 0959 05/13/17 1305   05/13/17 0600  piperacillin-tazobactam (ZOSYN) IVPB 3.375 g  Status:  Discontinued     3.375 g 12.5 mL/hr over 240 Minutes Intravenous Every 12 hours 05/12/17 1746 05/14/17 1115   05/12/17 2115  vancomycin (VANCOCIN) IVPB 1000 mg/200 mL premix     1,000 mg 200 mL/hr over 60 Minutes Intravenous  Once 05/12/17 2114 05/12/17 2258   05/12/17 1800  piperacillin-tazobactam (ZOSYN) IVPB 3.375 g     3.375 g 100 mL/hr over 30 Minutes Intravenous  Once 05/12/17 1746 05/12/17 1902       Objective: Vitals:   05/17/17 1030 05/17/17 1100 05/17/17 1120 05/17/17 1248  BP: (!) 109/57 (!) 99/39 136/77 130/75  Pulse: 91 90 91   Resp:   20   Temp:   97.6 F (36.4 C)   TempSrc:   Oral  SpO2:   98%   Weight:   35.7 kg (78 lb 11.3 oz)   Height:        Intake/Output Summary (Last 24 hours) at 05/17/2017 1319 Last data filed at 05/17/2017 1120 Gross per 24 hour  Intake 103 ml  Output 0 ml  Net 103 ml   Filed Weights   05/17/17 0600 05/17/17 0740 05/17/17 1120  Weight: 36.1 kg (79 lb 9.4 oz) 36.3 kg (80 lb 0.4 oz) 35.7 kg (78 lb 11.3 oz)    Examination: General exam: Appears comfortable - quite sleepy this AM HEENT: PERRLA, oral mucosa moist, no sclera icterus or thrush Respiratory system: Clear to auscultation. Respiratory effort  normal. Cardiovascular system: S1 & S2 heard, RRR.  No murmurs  Gastrointestinal system: Abdomen soft, non-tender, nondistended. Normal bowel sound. No organomegaly Central nervous system: Alert and oriented. No focal neurological deficits. Extremities: No cyanosis, clubbing or edema Skin: No rashes or ulcers Psychiatry:  Mood & affect appropriate.     Data Reviewed: I have personally reviewed following labs and imaging studies  CBC: Recent Labs  Lab 05/12/17 1343 05/12/17 1351 05/13/17 0324 05/14/17 0641 05/15/17 0542 05/17/17 0413  WBC 5.9  --  6.6 5.9 6.2 6.1  NEUTROABS 4.1  --   --   --   --   --   HGB 12.1 12.9 10.7* 11.0* 13.3 11.5*  HCT 37.1 38.0 34.1* 34.3* 40.1 35.3*  MCV 85.5  --  85.9 85.3 83.9 83.3  PLT 129*  --  109* 101* 134* 139*   Basic Metabolic Panel: Recent Labs  Lab 05/12/17 1343 05/12/17 1351 05/13/17 0324 05/14/17 0641 05/15/17 0542 05/17/17 0413  NA 141 138 140 137 135 136  K 4.4 4.2 4.7 4.7 3.9 4.8  CL 95* 96* 94* 93* 90* 91*  CO2 29  --  27 21* 18* 21*  GLUCOSE 160* 157* 153* 132* 120* 287*  BUN 41* 38* 47* 26* 21* 58*  CREATININE 6.48* 6.40* 7.71* 5.35* 4.29* 8.80*  CALCIUM 9.8  --  8.9 9.0 9.5 9.5   GFR: Estimated Creatinine Clearance: 4 mL/min (A) (by C-G formula based on SCr of 8.8 mg/dL (H)). Liver Function Tests: Recent Labs  Lab 05/12/17 1343 05/13/17 0324  AST 68* 52*  ALT 60* 48  ALKPHOS 128* 103  BILITOT 0.7 0.9  PROT 7.1 6.6  ALBUMIN 3.4* 3.2*   No results for input(s): LIPASE, AMYLASE in the last 168 hours. No results for input(s): AMMONIA in the last 168 hours. Coagulation Profile: No results for input(s): INR, PROTIME in the last 168 hours. Cardiac Enzymes: No results for input(s): CKTOTAL, CKMB, CKMBINDEX, TROPONINI in the last 168 hours. BNP (last 3 results) No results for input(s): PROBNP in the last 8760 hours. HbA1C: No results for input(s): HGBA1C in the last 72 hours. CBG: Recent Labs  Lab  05/16/17 1605 05/16/17 1931 05/16/17 2311 05/17/17 0310 05/17/17 0715  GLUCAP 281* 69 103* 156* 252*   Lipid Profile: No results for input(s): CHOL, HDL, LDLCALC, TRIG, CHOLHDL, LDLDIRECT in the last 72 hours. Thyroid Function Tests: No results for input(s): TSH, T4TOTAL, FREET4, T3FREE, THYROIDAB in the last 72 hours. Anemia Panel: No results for input(s): VITAMINB12, FOLATE, FERRITIN, TIBC, IRON, RETICCTPCT in the last 72 hours. Urine analysis: No results found for: COLORURINE, APPEARANCEUR, LABSPEC, PHURINE, GLUCOSEU, HGBUR, BILIRUBINUR, KETONESUR, PROTEINUR, UROBILINOGEN, NITRITE, LEUKOCYTESUR Sepsis Labs: @LABRCNTIP (procalcitonin:4,lacticidven:4) ) Recent Results (from the past 240 hour(s))  Culture, blood (Routine X 2) w Reflex to ID Panel  Status: None   Collection Time: 05/12/17  8:30 PM  Result Value Ref Range Status   Specimen Description BLOOD LEFT ARM  Final   Special Requests   Final    BOTTLES DRAWN AEROBIC AND ANAEROBIC Blood Culture adequate volume   Culture   Final    NO GROWTH 5 DAYS Performed at [facility], [address] [place], [place] [address]    Report Status 05/17/2017 FINAL  Final  Culture, blood (Routine X 2) w Reflex to ID Panel     Status: None   Collection Time: 05/12/17  8:44 PM  Result Value Ref Range Status   Specimen Description BLOOD LEFT ARM  Final   Special Requests IN PEDIATRIC BOTTLE Blood Culture adequate volume  Final   Culture   Final    NO GROWTH 5 DAYS Performed at [facility], [address], [place], [place] [address]    Report Status 05/17/2017 FINAL  Final  Respiratory Panel by PCR     Status: None   Collection Time: 05/12/17  9:40 PM  Result Value Ref Range Status   Adenovirus NOT DETECTED NOT DETECTED Final   Coronavirus 229E NOT DETECTED NOT DETECTED Final   Coronavirus HKU1 NOT DETECTED NOT DETECTED Final   Coronavirus NL63 NOT DETECTED NOT DETECTED Final   Coronavirus OC43 NOT DETECTED NOT  DETECTED Final   Metapneumovirus NOT DETECTED NOT DETECTED Final   Rhinovirus / Enterovirus NOT DETECTED NOT DETECTED Final   Influenza A NOT DETECTED NOT DETECTED Final   Influenza A H1 NOT DETECTED NOT DETECTED Final   Influenza A H1 2009 NOT DETECTED NOT DETECTED Final   Influenza A H3 NOT DETECTED NOT DETECTED Final   Influenza B NOT DETECTED NOT DETECTED Final   Parainfluenza Virus 1 NOT DETECTED NOT DETECTED Final   Parainfluenza Virus 2 NOT DETECTED NOT DETECTED Final   Parainfluenza Virus 3 NOT DETECTED NOT DETECTED Final   Parainfluenza Virus 4 NOT DETECTED NOT DETECTED Final   Respiratory Syncytial Virus NOT DETECTED NOT DETECTED Final   Bordetella pertussis NOT DETECTED NOT DETECTED Final   Chlamydophila pneumoniae NOT DETECTED NOT DETECTED Final   Mycoplasma pneumoniae NOT DETECTED NOT DETECTED Final    Comment: Performed at [facility], [address], [place], [place] [address]  MRSA PCR Screening     Status: None   Collection Time: 05/12/17  9:40 PM  Result Value Ref Range Status   MRSA by PCR NEGATIVE NEGATIVE Final    Comment:        The GeneXpert MRSA Assay (FDA approved for NASAL specimens only), is one component of a comprehensive MRSA colonization surveillance program. It is not intended to diagnose MRSA infection nor to guide or monitor treatment for MRSA infections. Performed at [facility], [address], [place], [place] [address]          Radiology Studies: No results found.    Scheduled Meds: . amLODipine  5 mg Oral Daily  . ferric citrate  840 mg Oral TID WC  . heparin  5,000 Units Subcutaneous Q8H  . insulin aspart  0-9 Units Subcutaneous TID WC  . levETIRAcetam  1,000 mg Oral Daily   And  . levETIRAcetam  500 mg Oral Q M,W,F-1800  . LORazepam  2 mg Intravenous Once  . mouth rinse  15 mL Mouth Rinse BID  . sodium chloride flush  3 mL Intravenous Q12H   Continuous Infusions:  LOS: 5 days    Time spent in  minutes: 35    Calvert Cantor, MD [facility] Pager: [email or web address] Password [email or web address] 05/17/2017, 1:19 PM

## 2017-05-17 NOTE — Progress Notes (Signed)
[facility] ROUNDING NOTE   Subjective:   Patient seen on dialysis no complaints  History of CVA and seizure disorder and diabetes  She was admitted with a seizure and was loaded with keppra and fosphenytoin and dilantin   She has been seizure free  MRI pending although appreciate neurology assisting with management of this complicated patient  Objective:  Vital signs in last 24 hours:  Temp:  [97.5 F (36.4 C)-98.2 F (36.8 C)] 97.5 F (36.4 C) (02/11 0740) Pulse Rate:  [86-107] 86 (02/11 0800) Resp:  [17-22] 21 (02/11 0740) BP: (91-161)/(55-85) 161/85 (02/11 0800) SpO2:  [93 %-99 %] 98 % (02/11 0740) Weight:  [79 lb 9.4 oz (36.1 kg)-80 lb 0.4 oz (36.3 kg)] 80 lb 0.4 oz (36.3 kg) (02/11 0740)  Weight change: 10.6 oz (0.3 kg) Filed Weights   05/16/17 0422 05/17/17 0600 05/17/17 0740  Weight: 78 lb 14.8 oz (35.8 kg) 79 lb 9.4 oz (36.1 kg) 80 lb 0.4 oz (36.3 kg)    Intake/Output: I/O last 3 completed shifts: In: 223 [P.O.:220; I.V.:3] Out: 0    Intake/Output this shift:  No intake/output data recorded.  Small AAF, alert, confused No jvd Chest clear bilat RRR tachy no MRG Abd soft ntnd Ext no edema R AVG +bruit     Basic Metabolic Panel: Recent Labs  Lab 05/12/17 1343 05/12/17 1351 05/13/17 0324 05/14/17 0641 05/15/17 0542 05/17/17 0413  NA 141 138 140 137 135 136  K 4.4 4.2 4.7 4.7 3.9 4.8  CL 95* 96* 94* 93* 90* 91*  CO2 29  --  27 21* 18* 21*  GLUCOSE 160* 157* 153* 132* 120* 287*  BUN 41* 38* 47* 26* 21* 58*  CREATININE 6.48* 6.40* 7.71* 5.35* 4.29* 8.80*  CALCIUM 9.8  --  8.9 9.0 9.5 9.5    Liver Function Tests: Recent Labs  Lab 05/12/17 1343 05/13/17 0324  AST 68* 52*  ALT 60* 48  ALKPHOS 128* 103  BILITOT 0.7 0.9  PROT 7.1 6.6  ALBUMIN 3.4* 3.2*   No results for input(s): LIPASE, AMYLASE in the last 168 hours. No results for input(s): AMMONIA in the last 168 hours.  CBC: Recent Labs  Lab 05/12/17 1343 05/12/17 1351  05/13/17 0324 05/14/17 0641 05/15/17 0542 05/17/17 0413  WBC 5.9  --  6.6 5.9 6.2 6.1  NEUTROABS 4.1  --   --   --   --   --   HGB 12.1 12.9 10.7* 11.0* 13.3 11.5*  HCT 37.1 38.0 34.1* 34.3* 40.1 35.3*  MCV 85.5  --  85.9 85.3 83.9 83.3  PLT 129*  --  109* 101* 134* 139*    Cardiac Enzymes: No results for input(s): CKTOTAL, CKMB, CKMBINDEX, TROPONINI in the last 168 hours.  BNP: Invalid input(s): POCBNP  CBG: Recent Labs  Lab 05/16/17 1605 05/16/17 1931 05/16/17 2311 05/17/17 0310 05/17/17 0715  GLUCAP 281* 69 103* 156* 252*    Microbiology: Results for orders placed or performed during the hospital encounter of 05/12/17  Culture, blood (Routine X 2) w Reflex to ID Panel     Status: None (Preliminary result)   Collection Time: 05/12/17  8:30 PM  Result Value Ref Range Status   Specimen Description BLOOD LEFT ARM  Final   Special Requests   Final    BOTTLES DRAWN AEROBIC AND ANAEROBIC Blood Culture adequate volume   Culture   Final    NO GROWTH 4 DAYS Performed at [facility], [address] [place], [place] [address]    Report Status PENDING  Incomplete  Culture, blood (Routine X 2) w Reflex to ID Panel     Status: None (Preliminary result)   Collection Time: 05/12/17  8:44 PM  Result Value Ref Range Status   Specimen Description BLOOD LEFT ARM  Final   Special Requests IN PEDIATRIC BOTTLE Blood Culture adequate volume  Final   Culture   Final    NO GROWTH 4 DAYS Performed at [facility], [address] [place], [place] [address]    Report Status PENDING  Incomplete  Respiratory Panel by PCR     Status: None   Collection Time: 05/12/17  9:40 PM  Result Value Ref Range Status   Adenovirus NOT DETECTED NOT DETECTED Final   Coronavirus 229E NOT DETECTED NOT DETECTED Final   Coronavirus HKU1 NOT DETECTED NOT DETECTED Final   Coronavirus NL63 NOT DETECTED NOT DETECTED Final   Coronavirus OC43 NOT DETECTED NOT DETECTED Final   Metapneumovirus NOT  DETECTED NOT DETECTED Final   Rhinovirus / Enterovirus NOT DETECTED NOT DETECTED Final   Influenza A NOT DETECTED NOT DETECTED Final   Influenza A H1 NOT DETECTED NOT DETECTED Final   Influenza A H1 2009 NOT DETECTED NOT DETECTED Final   Influenza A H3 NOT DETECTED NOT DETECTED Final   Influenza B NOT DETECTED NOT DETECTED Final   Parainfluenza Virus 1 NOT DETECTED NOT DETECTED Final   Parainfluenza Virus 2 NOT DETECTED NOT DETECTED Final   Parainfluenza Virus 3 NOT DETECTED NOT DETECTED Final   Parainfluenza Virus 4 NOT DETECTED NOT DETECTED Final   Respiratory Syncytial Virus NOT DETECTED NOT DETECTED Final   Bordetella pertussis NOT DETECTED NOT DETECTED Final   Chlamydophila pneumoniae NOT DETECTED NOT DETECTED Final   Mycoplasma pneumoniae NOT DETECTED NOT DETECTED Final    Comment: Performed at [facility], [address] [place], [place] [address]  MRSA PCR Screening     Status: None   Collection Time: 05/12/17  9:40 PM  Result Value Ref Range Status   MRSA by PCR NEGATIVE NEGATIVE Final    Comment:        The GeneXpert MRSA Assay (FDA approved for NASAL specimens only), is one component of a comprehensive MRSA colonization surveillance program. It is not intended to diagnose MRSA infection nor to guide or monitor treatment for MRSA infections. Performed at [facility], [address] [place], [place] [address]     Coagulation Studies: No results for input(s): LABPROT, INR in the last 72 hours.  Urinalysis: No results for input(s): COLORURINE, LABSPEC, PHURINE, GLUCOSEU, HGBUR, BILIRUBINUR, KETONESUR, PROTEINUR, UROBILINOGEN, NITRITE, LEUKOCYTESUR in the last 72 hours.  Invalid input(s): APPERANCEUR    Imaging: No results found.   Medications:    . amLODipine  5 mg Oral Daily  . ferric citrate  840 mg Oral TID WC  . [START ON 05/18/2017] heparin  1,300 Units Dialysis Once in dialysis  . [START ON 05/18/2017] heparin  1,300 Units Dialysis Once  in dialysis  . heparin  5,000 Units Subcutaneous Q8H  . insulin aspart  0-9 Units Subcutaneous TID WC  . levETIRAcetam  1,000 mg Oral Daily   And  . levETIRAcetam  500 mg Oral Q M,W,F-1800  . LORazepam  2 mg Intravenous Once  . mouth rinse  15 mL Mouth Rinse BID  . sodium chloride flush  3 mL Intravenous Q12H   [DISCONTINUED] acetaminophen **OR** acetaminophen, hydrALAZINE, [DISCONTINUED] ondansetron **  OR** ondansetron (ZOFRAN) IV  Assessment/ Plan:   Dialysis: [facility] M[facility] 3.5h   F160   450/ 1.5   38kg   2/2 bath  P4   RUE AVG  Hep 1300 Hectorol IV TIW        Impression: 1  Seizures/ status epilepticus - still confused, per neuro will dc dilantin and continue Keppra 2  SOB/ vol overload- resolved, new dry wt at dc 3  ESRD MWF HD  4  HTN bp's good 5  Vol under dry, lower at dc 6  MBD ckd , no chg meds 7  Anemia ckd , Hb good, no need esa at this time 8  Hx CVA      LOS: 5 Terrel Manalo W @TODAY @8 :49 AM

## 2017-05-18 ENCOUNTER — Inpatient Hospital Stay
Admission: RE | Admit: 2017-05-18 | Discharge: 2017-05-18 | Disposition: A | Discharge status: Home / Self Care | Payer: Self-pay | Source: Ambulatory Visit | Attending: Internal Medicine | Admitting: Internal Medicine

## 2017-05-18 ENCOUNTER — Ambulatory Visit: Payer: Self-pay

## 2017-05-18 DIAGNOSIS — R269 Unspecified abnormalities of gait and mobility: Secondary | ICD-10-CM

## 2017-05-18 DIAGNOSIS — R740 Nonspecific elevation of levels of transaminase and lactic acid dehydrogenase [LDH]: Secondary | ICD-10-CM

## 2017-05-18 DIAGNOSIS — I69398 Other sequelae of cerebral infarction: Secondary | ICD-10-CM

## 2017-05-18 DIAGNOSIS — D696 Thrombocytopenia, unspecified: Secondary | ICD-10-CM

## 2017-05-18 LAB — GLUCOSE, CAPILLARY
GLUCOSE-CAPILLARY: 407 mg/dL — AB (ref 65–99)
GLUCOSE-CAPILLARY: 412 mg/dL — AB (ref 65–99)
GLUCOSE-CAPILLARY: 182 mg/dL — AB (ref 65–99)
GLUCOSE-CAPILLARY: 98 mg/dL (ref 65–99)
GLUCOSE-CAPILLARY: 172 mg/dL — AB (ref 65–99)
GLUCOSE-CAPILLARY: 160 mg/dL — AB (ref 65–99)
Glucose-Capillary: 139 mg/dL — ABNORMAL HIGH (ref 65–99)

## 2017-05-18 MED ORDER — LANTUS SOLOSTAR 100 UNIT/ML ~~LOC~~ SOPN: 10.0000 [IU] | PEN_INJECTOR | Freq: Every day | SUBCUTANEOUS | 5 refills | Status: AC

## 2017-05-18 MED ORDER — NEPRO/CARBSTEADY PO LIQD
237.0000 mL | Freq: Two times a day (BID) | ORAL | Status: DC
  Filled 2017-05-18 (×2): qty 237

## 2017-05-18 MED ORDER — LEVETIRACETAM 500 MG PO TABS: 500.0000 mg | ORAL_TABLET | ORAL | Status: AC

## 2017-05-18 MED ORDER — LEVETIRACETAM 1000 MG PO TABS: 1000.0000 mg | ORAL_TABLET | Freq: Every day | ORAL | Status: AC

## 2017-05-18 NOTE — Clinical Social Work Placement (Signed)
   CLINICAL SOCIAL WORK PLACEMENT  NOTE  Date:  05/18/2017  Patient Details  Name: Alexis Sims MRN: [id number] Date of Birth: Aug 05, 1960  Clinical Social Work is seeking post-discharge placement for this patient at the Skilled  Nursing Facility level of care (*CSW will initial, date and re-position this form in  chart as items are completed):  Yes   Patient/family provided with [facility] Department's list of facilities offering this level of care within the geographic area requested by the patient (or if unable, by the patient's family).  Yes   Patient/family informed of their freedom to choose among providers that offer the needed level of care, that participate in Medicare, Medicaid or managed care program needed by the patient, have an available bed and are willing to accept the patient.  Yes   Patient/family informed of [facility]'s ownership interest in [facility] and [facility], as well as of the fact that they are under no obligation to receive care at these facilities.  PASRR submitted to EDS on 05/16/17     PASRR number received on 05/16/17     Existing PASRR number confirmed on       FL2 transmitted to all facilities in geographic area requested by pt/family on 05/16/17     FL2 transmitted to all facilities within larger geographic area on       Patient informed that his/her managed care company has contracts with or will negotiate with certain facilities, including the following:        Yes   Patient/family informed of bed offers received.  [facility]     Physician recommends and patient chooses bed at      Patient to be transferred to [facility] on 05/18/17.  Patient to be transferred to facility by ptar     Patient family notified on 05/18/17 of transfer.  Name of family member notified:        PHYSICIAN Please sign FL2     Additional Comment:    _______________________________________________ Burna SisUris,  Melisha Eggleton H, LCSW 05/18/2017, 2:26 PM

## 2017-05-18 NOTE — Progress Notes (Addendum)
Subjective: No complaint and no further seizures.   Exam: Vitals:   05/18/17 0400 05/18/17 0700  BP: 109/67   Pulse: 88   Resp: 16   Temp:  97.6 F (36.4 C)  SpO2: 97%     Physical Exam   HEENT-  Normocephalic, no lesions, without obvious abnormality.  Normal external eye and conjunctiva.   Cardiovascular- S1-S2 audible, pulses palpable throughout   Lungs-no rhonchi or wheezing noted, no excessive working breathing.  Saturations within normal limits Abdomen- All 4 quadrants palpated and nontender Extremities- Warm, dry and intact Musculoskeletal-no joint tenderness, deformity or swelling Skin-warm and dry, no hyperpigmentation, vitiligo, or suspicious lesions    Neuro:  No change Neuro: MS: Awake,  conversant, not sure where she is but does know it is Tuesday and follows commands CN: Left pupil reactive , R postsurgical visual fields full Motor: Moves all extremities symmetrically Sensory: Intact light touch      Medications:  Scheduled: . amLODipine  5 mg Oral Daily  . ferric citrate  840 mg Oral TID WC  . heparin  5,000 Units Subcutaneous Q8H  . insulin aspart  0-5 Units Subcutaneous QHS  . insulin aspart  0-9 Units Subcutaneous TID WC  . insulin glargine  10 Units Subcutaneous QHS  . levETIRAcetam  1,000 mg Oral Daily   And  . levETIRAcetam  500 mg Oral Q M,W,F-1800  . LORazepam  2 mg Intravenous Once  . mouth rinse  15 mL Mouth Rinse BID  . sodium chloride flush  3 mL Intravenous Q12H    Pertinent Labs/Diagnostics:   Mr Brain Wo Contrast  Result Date: 05/17/2017 IMPRESSION: 1. No acute intracranial abnormality. 2. Multifocal encephalomalacia of the superior left frontal lobe, left frontal operculum and left temporal lobe with ex vacuo dilatation of the ventricles. 3. 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 Electronically Signed    By: Deatra RobinsonKevin  Herman M.D.   On: 05/17/2017 21:52     Felicie MornDavid Smith PA-C [facility] [phone]  Impression: 57 YO with Status epilepticus which is now resolved. MRI shows no acute intracranial abnormalities, but with chronic left cerebral hemisphere encephalomalacia and volume loss that could certainly predispose to seizures.  Recommendations: 1) continue Keppra 1 g daily with additional 500 mg after dialysis 2) Neurology S/O call with questions.   Felicie Mornavid Smith PA-C [facility] [phone]  M-F  (8:30 am- 4 PM)  05/18/2017, 10:30 AM  Electronically signed: Dr. Caryl PinaEric Myriam Brandhorst     ,

## 2017-05-18 NOTE — Discharge Summary (Signed)
Physician Discharge Summary  Alexis Sims ZOX:[id number] DOB: 14-Dec-1960 DOA: 05/12/2017  PCP: Fleet Contras, MD  Admit date: 05/12/2017 Discharge date: 05/18/2017  Admitted From: home  Disposition:  SNF   Recommendations for Outpatient Follow-up:  1. F/u with neurology as outpt  Discharge Condition:  stable   CODE STATUS:  DNR   Consultations:  Neurology  Nephrology  PCCM    Discharge Diagnoses:  Principal Problem:   Status epilepticus (HCC) Active Problems: Acute encepahlopathy   CKD (chronic kidney disease) stage V requiring chronic dialysis (HCC)   History of CVA (cerebrovascular accident)   HTN (hypertension)   Gait disturbance, post-stroke   Diabetes mellitus type 2, insulin dependent (HCC)   Transaminitis   Thrombocytopenia (HCC)    Subjective: No complaints.   Brief Summary: Alexis Sims 57 year old female with past medical history as below, which is significant for ESRD on HD, diabetes, CVA, and seizures. She had has status epilepticus in the past requiring intubation. On 2/6 while taking the bus to dialysis she had 2 witnessed seizures. EMS was called and she had one additional seizure in route and another in the emergency department. She was loaded with Keppra, fosphenytoin, and Dilantin under neurology guidance. EEG was done in the emergency department showing no current seizures, however she did remain markedly lethargic in the postictal period and was admitted to the ICU for close monitoring.       Hospital Course:  Principal Problem:   Status epilepticus   - cont Keppra - neurology is following and has been managing- MRI shows no acute issues- see report below- she has been quite stable and is ready for d/c to SNF  Active Problems:  Acute encephalopathy - ? Hospital acquired delirium vs cognitive impairment due to above seizures- slowly improving  Acute hypoxemic respiratory failure - suspected to be either aspiration or pulm edema - Zosyn has  been discontinued- continuting to follow respiratory status   CKD (chronic kidney disease) stage V requiring chronic dialysis  - cont dialysis per nephrology   History of CVA (cerebrovascular accident) - PT eval to assess gait   HTN (hypertension) - on Amlodipine at home    Diabetes mellitus type 2, insulin dependent  - receives Lantus 15 U at bedtime at home- have resumed at 10 U daily- cont SSI  Hyperphosphatemia - cont Auryxia and Velphoro  Transaminitis - noted to have mildly elevated LFTs  on admission which are improving   Thrombocytopenia  - stable - chronic issues dating back at least to 2017 (per labs in Epic) -  cont to follow  Discharge Exam: Vitals:   05/18/17 0700 05/18/17 1100  BP:    Pulse:    Resp:    Temp: 97.6 F (36.4 C) 98.4 F (36.9 C)  SpO2:     Vitals:   05/18/17 0400 05/18/17 0449 05/18/17 0700 05/18/17 1100  BP: 109/67     Pulse: 88     Resp: 16     Temp:   97.6 F (36.4 C) 98.4 F (36.9 C)  TempSrc:   Oral Oral  SpO2: 97%     Weight:  37.3 kg (82 lb 3.7 oz)    Height:        General: Pt is alert, awake, not in acute distress Cardiovascular: RRR, S1/S2 +, no rubs, no gallops Respiratory: CTA bilaterally, no wheezing, no rhonchi Abdominal: Soft, NT, ND, bowel sounds + Extremities: no edema, no cyanosis   Discharge Instructions  Discharge Instructions    Increase  activity slowly   Complete by:  As directed      Allergies as of 05/18/2017   No Known Allergies     Medication List    TAKE these medications   amLODipine 5 MG tablet Commonly known as:  NORVASC Take 5 mg by mouth daily.   AURYXIA 1 GM 210 MG(Fe) tablet Generic drug:  ferric citrate Take 840 mg by mouth 3 (three) times daily with meals.   LANTUS SOLOSTAR 100 UNIT/ML Solostar Pen Generic drug:  Insulin Glargine Inject 10 Units into the skin at bedtime. What changed:  how much to take   levETIRAcetam 1000 MG tablet Commonly known as:  KEPPRA Take  1 tablet (1,000 mg total) by mouth daily. Start taking on:  05/19/2017   levETIRAcetam 500 MG tablet Commonly known as:  KEPPRA Take 1 tablet (500 mg total) by mouth every Monday, Wednesday, and Friday at 6 PM. Start taking on:  05/19/2017   VELPHORO 500 MG chewable tablet Generic drug:  sucroferric oxyhydroxide Chew 1,000 mg by mouth 3 (three) times daily.       No Known Allergies   Procedures/Studies:    Ct Head Wo Contrast  Result Date: 05/12/2017 CLINICAL DATA:  Seizure.  Hypertension EXAM: CT HEAD WITHOUT CONTRAST TECHNIQUE: Contiguous axial images were obtained from the base of the skull through the vertex without intravenous contrast. COMPARISON:  None. FINDINGS: Brain: Encephalomalacia in the anterior left frontal lobe underlying a calvarial defect, possibly burr hole or craniectomy defect. Mild cerebral atrophy. Ventriculomegaly likely related to ex vacuo dilatation. No hemorrhage. There is an area of low-density in the left temporal lobe concerning for infarct, age indeterminate. Vascular: No hyperdense vessel or unexpected calcification. Skull: No acute calvarial abnormality. Sinuses/Orbits: Visualized paranasal sinuses and mastoids clear. Orbital soft tissues unremarkable. Other: None IMPRESSION: Area of encephalomalacia within the anterior left frontal lobe underlying calvarial defects, likely burr hole. Area of low-density in the left temporal lobe, age indeterminate infarct. Atrophy, associated ventriculomegaly. Electronically Signed   By: Charlett Nose M.D.   On: 05/12/2017 12:53   Mr Brain Wo Contrast  Result Date: 05/17/2017 CLINICAL DATA:  New onset seizure. EXAM: MRI HEAD WITHOUT CONTRAST TECHNIQUE: Multiplanar, multiecho pulse sequences of the brain and surrounding structures were obtained without intravenous contrast. COMPARISON:  Head CT 05/12/2017 FINDINGS: Brain: The midline structures are normal. There is no acute infarct or acute hemorrhage. No mass lesion,  hydrocephalus, dural abnormality or extra-axial collection. There is superior left frontal lobe encephalomalacia underlying the site of a remote burr hole craniotomy. There is also encephalomalacia of the left frontal operculum and left temporal lobe. There is diffuse cerebral and cerebellar atrophy with ex vacuo dilatation of the ventricles. There is markedly asymmetric volume loss of the left mesial temporal lobe with increased hyperintense T2-weighted signal and loss of normal hippocampal architecture. No chronic microhemorrhage or superficial siderosis. Vascular: Major intracranial arterial and venous sinus flow voids are preserved. Skull and upper cervical spine: The visualized skull base, calvarium, upper cervical spine and extracranial soft tissues are normal. Sinuses/Orbits: No fluid levels or advanced mucosal thickening. No mastoid or middle ear effusion. Normal orbits. IMPRESSION: 1. No acute intracranial abnormality. 2. Multifocal encephalomalacia of the superior left frontal lobe, left frontal operculum and left temporal lobe with ex vacuo dilatation of the ventricles. 3. 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 Electronically Signed   By: Caryn Bee  Chase PicketHerman M.D.   On: 05/17/2017 21:52   Dg Chest Port 1 View  Result Date: 05/14/2017 CLINICAL DATA:  Acute encephalopathy EXAM: PORTABLE CHEST 1 VIEW COMPARISON:  Two days ago FINDINGS: Right more than left hazy lung opacity. There is a streaky retrocardiac density. Chronic cardiomegaly. No Kerley lines or visible effusion. No pneumothorax. Extensive venous stenting in the right upper extremity. IMPRESSION: 1. Hazy opacity of the right more than left chest which could be edema or pneumonitis. There is a retrocardiac streaky opacity favoring atelectasis. 2. Chronic cardiomegaly. Electronically Signed   By: Marnee SpringJonathon  Watts M.D.   On:  05/14/2017 10:09   Dg Chest Port 1 View  Result Date: 05/12/2017 CLINICAL DATA:  Seizure today with aspiration pneumonia. EXAM: PORTABLE CHEST 1 VIEW COMPARISON:  05/31/2015 FINDINGS: Mild cardiomegaly. Mediastinal shadows are normal. There chronic lung markings, but interstitial and alveolar density appears accentuated. This is more consistent with acute edema. Density is slightly more focally prominent in the left lower lobe where there could be some aspiration. No effusions. No acute bone finding. Multiple vascular stents remain evident on the right. IMPRESSION: Interstitial and alveolar density consistent with edema superimposed upon chronic lung disease. More focal density in the left lower lobe could be aspiration. Electronically Signed   By: Paulina FusiMark  Shogry M.D.   On: 05/12/2017 17:26     The results of significant diagnostics from this hospitalization (including imaging, microbiology, ancillary and laboratory) are listed below for reference.     Microbiology: Recent Results (from the past 240 hour(s))  Culture, blood (Routine X 2) w Reflex to ID Panel     Status: None   Collection Time: 05/12/17  8:30 PM  Result Value Ref Range Status   Specimen Description BLOOD LEFT ARM  Final   Special Requests   Final    BOTTLES DRAWN AEROBIC AND ANAEROBIC Blood Culture adequate volume   Culture   Final    NO GROWTH 5 DAYS Performed at [facility], [address] [place], [place] [address]    Report Status 05/17/2017 FINAL  Final  Culture, blood (Routine X 2) w Reflex to ID Panel     Status: None   Collection Time: 05/12/17  8:44 PM  Result Value Ref Range Status   Specimen Description BLOOD LEFT ARM  Final   Special Requests IN PEDIATRIC BOTTLE Blood Culture adequate volume  Final   Culture   Final    NO GROWTH 5 DAYS Performed at [facility], [address] [place], [place] [address]    Report Status 05/17/2017 FINAL  Final  Respiratory Panel by PCR     Status: None    Collection Time: 05/12/17  9:40 PM  Result Value Ref Range Status   Adenovirus NOT DETECTED NOT DETECTED Final   Coronavirus 229E NOT DETECTED NOT DETECTED Final   Coronavirus HKU1 NOT DETECTED NOT DETECTED Final   Coronavirus NL63 NOT DETECTED NOT DETECTED Final   Coronavirus OC43 NOT DETECTED NOT DETECTED Final   Metapneumovirus NOT DETECTED NOT DETECTED Final   Rhinovirus / Enterovirus NOT DETECTED NOT DETECTED Final   Influenza A NOT DETECTED NOT DETECTED Final   Influenza A H1 NOT DETECTED NOT DETECTED Final   Influenza A H1 2009 NOT DETECTED NOT DETECTED Final   Influenza A H3 NOT DETECTED NOT DETECTED Final   Influenza B NOT DETECTED NOT DETECTED Final   Parainfluenza Virus 1 NOT DETECTED NOT DETECTED Final   Parainfluenza Virus 2 NOT DETECTED  NOT DETECTED Final   Parainfluenza Virus 3 NOT DETECTED NOT DETECTED Final   Parainfluenza Virus 4 NOT DETECTED NOT DETECTED Final   Respiratory Syncytial Virus NOT DETECTED NOT DETECTED Final   Bordetella pertussis NOT DETECTED NOT DETECTED Final   Chlamydophila pneumoniae NOT DETECTED NOT DETECTED Final   Mycoplasma pneumoniae NOT DETECTED NOT DETECTED Final    Comment: Performed at [facility], [address], [place], [place] [address]  MRSA PCR Screening     Status: None   Collection Time: 05/12/17  9:40 PM  Result Value Ref Range Status   MRSA by PCR NEGATIVE NEGATIVE Final    Comment:        The GeneXpert MRSA Assay (FDA approved for NASAL specimens only), is one component of a comprehensive MRSA colonization surveillance program. It is not intended to diagnose MRSA infection nor to guide or monitor treatment for MRSA infections. Performed at [facility], [address] [place], [place] [address]      Labs: BNP (last 3 results) No results for input(s): BNP in the last 8760 hours. Basic Metabolic Panel: Recent Labs  Lab 05/12/17 1343 05/12/17 1351 05/13/17 0324 05/14/17 0641 05/15/17 0542  05/17/17 0413  NA 141 138 140 137 135 136  K 4.4 4.2 4.7 4.7 3.9 4.8  CL 95* 96* 94* 93* 90* 91*  CO2 29  --  27 21* 18* 21*  GLUCOSE 160* 157* 153* 132* 120* 287*  BUN 41* 38* 47* 26* 21* 58*  CREATININE 6.48* 6.40* 7.71* 5.35* 4.29* 8.80*  CALCIUM 9.8  --  8.9 9.0 9.5 9.5   Liver Function Tests: Recent Labs  Lab 05/12/17 1343 05/13/17 0324  AST 68* 52*  ALT 60* 48  ALKPHOS 128* 103  BILITOT 0.7 0.9  PROT 7.1 6.6  ALBUMIN 3.4* 3.2*   No results for input(s): LIPASE, AMYLASE in the last 168 hours. No results for input(s): AMMONIA in the last 168 hours. CBC: Recent Labs  Lab 05/12/17 1343 05/12/17 1351 05/13/17 0324 05/14/17 0641 05/15/17 0542 05/17/17 0413  WBC 5.9  --  6.6 5.9 6.2 6.1  NEUTROABS 4.1  --   --   --   --   --   HGB 12.1 12.9 10.7* 11.0* 13.3 11.5*  HCT 37.1 38.0 34.1* 34.3* 40.1 35.3*  MCV 85.5  --  85.9 85.3 83.9 83.3  PLT 129*  --  109* 101* 134* 139*   Cardiac Enzymes: No results for input(s): CKTOTAL, CKMB, CKMBINDEX, TROPONINI in the last 168 hours. BNP: Invalid input(s): POCBNP CBG: Recent Labs  Lab 05/17/17 2322 05/18/17 0143 05/18/17 0507 05/18/17 0726 05/18/17 1135  GLUCAP 412* 182* 98 139* 172*   D-Dimer No results for input(s): DDIMER in the last 72 hours. Hgb A1c No results for input(s): HGBA1C in the last 72 hours. Lipid Profile No results for input(s): CHOL, HDL, LDLCALC, TRIG, CHOLHDL, LDLDIRECT in the last 72 hours. Thyroid function studies No results for input(s): TSH, T4TOTAL, T3FREE, THYROIDAB in the last 72 hours.  Invalid input(s): FREET3 Anemia work up No results for input(s): VITAMINB12, FOLATE, FERRITIN, TIBC, IRON, RETICCTPCT in the last 72 hours. Urinalysis No results found for: COLORURINE, APPEARANCEUR, LABSPEC, PHURINE, GLUCOSEU, HGBUR, BILIRUBINUR, KETONESUR, PROTEINUR, UROBILINOGEN, NITRITE, LEUKOCYTESUR Sepsis Labs Invalid input(s): PROCALCITONIN,  WBC,  LACTICIDVEN Microbiology Recent Results (from  the past 240 hour(s))  Culture, blood (Routine X 2) w Reflex to ID Panel     Status: None   Collection Time: 05/12/17  8:30 PM  Result Value Ref Range Status   Specimen Description BLOOD LEFT ARM  Final   Special Requests   Final    BOTTLES DRAWN AEROBIC AND ANAEROBIC Blood Culture adequate volume   Culture   Final    NO GROWTH 5 DAYS Performed at [facility], [address], [place], [place] [address]    Report Status 05/17/2017 FINAL  Final  Culture, blood (Routine X 2) w Reflex to ID Panel     Status: None   Collection Time: 05/12/17  8:44 PM  Result Value Ref Range Status   Specimen Description BLOOD LEFT ARM  Final   Special Requests IN PEDIATRIC BOTTLE Blood Culture adequate volume  Final   Culture   Final    NO GROWTH 5 DAYS Performed at [facility], [address] [place], [place] [address]    Report Status 05/17/2017 FINAL  Final  Respiratory Panel by PCR     Status: None   Collection Time: 05/12/17  9:40 PM  Result Value Ref Range Status   Adenovirus NOT DETECTED NOT DETECTED Final   Coronavirus 229E NOT DETECTED NOT DETECTED Final   Coronavirus HKU1 NOT DETECTED NOT DETECTED Final   Coronavirus NL63 NOT DETECTED NOT DETECTED Final   Coronavirus OC43 NOT DETECTED NOT DETECTED Final   Metapneumovirus NOT DETECTED NOT DETECTED Final   Rhinovirus / Enterovirus NOT DETECTED NOT DETECTED Final   Influenza A NOT DETECTED NOT DETECTED Final   Influenza A H1 NOT DETECTED NOT DETECTED Final   Influenza A H1 2009 NOT DETECTED NOT DETECTED Final   Influenza A H3 NOT DETECTED NOT DETECTED Final   Influenza B NOT DETECTED NOT DETECTED Final   Parainfluenza Virus 1 NOT DETECTED NOT DETECTED Final   Parainfluenza Virus 2 NOT DETECTED NOT DETECTED Final   Parainfluenza Virus 3 NOT DETECTED NOT DETECTED Final   Parainfluenza Virus 4 NOT DETECTED NOT DETECTED Final   Respiratory Syncytial Virus NOT DETECTED NOT DETECTED Final   Bordetella pertussis NOT DETECTED NOT  DETECTED Final   Chlamydophila pneumoniae NOT DETECTED NOT DETECTED Final   Mycoplasma pneumoniae NOT DETECTED NOT DETECTED Final    Comment: Performed at [facility], [address] [place], [place] [address]  MRSA PCR Screening     Status: None   Collection Time: 05/12/17  9:40 PM  Result Value Ref Range Status   MRSA by PCR NEGATIVE NEGATIVE Final    Comment:        The GeneXpert MRSA Assay (FDA approved for NASAL specimens only), is one component of a comprehensive MRSA colonization surveillance program. It is not intended to diagnose MRSA infection nor to guide or monitor treatment for MRSA infections. Performed at [facility], [address] [place], [place] [address]      Time coordinating discharge: Over 30 minutes  SIGNED:   Calvert Cantor, MD  [facility] 05/18/2017, 11:55 AM Pager   If 7PM-7AM, please contact night-coverage [email or web address] Password [email or web address]

## 2017-05-18 NOTE — Progress Notes (Signed)
Initial Nutrition Assessment  DOCUMENTATION CODES:   Underweight  INTERVENTION:   Nepro po BID, each supplement provides 425 kcal and 19.1 grams of protein  Assistance with meals   NUTRITION DIAGNOSIS:   Inadequate oral intake related to lethargy/confusion as evidenced by meal completion < 25%.   GOAL:   Patient will meet greater than or equal to 90% of their needs   MONITOR:   PO intake, Supplement acceptance, Weight trends, Labs, I & O's  REASON FOR ASSESSMENT:   Malnutrition Screening Tool    ASSESSMENT:   [age] yo female admitted 2/6 with status epilepticus, acute encephalopathy, hypoxic respiratory failure, transaminitis, thrombocytopenia. PMH ESRD on HD (MWF), CVA, T2DM, HTN.  Pt was living with siblings, but current discharge plan to SNF where sister works Psychiatric nurse([employer])  Pt has been on dialysis past 4167yrs.  Pt has AMS and was not able to respond coherently to questions during nutrition assessment. S/w RN who reported pt is slowly improving but, "the answer to all of my questions is Monday or Wednesday". RN reports that pt is not eating well d/t current AMS (2-3 bites per meal); however, pt does not have any swallowing difficulties.  Per medical record, pt has lost weight since admission, however, Nephrology noted that pt had not been to HD since 1/28 (first wt taken during this admission was 2/6).   Nephrologist also noted the pt was not been meeting her dry weight PTA, "left 4.7kg over EDW on 1/28".  Wt Readings from Last 15 Encounters:  05/18/17 82 lb 3.7 oz (37.3 kg)  06/01/15 91 lb 7.9 oz (41.5 kg)    Medications include:  Ferric citrate Insulin HS and SS Lantus KEPPRA Ativan   Labs reviewed:  CBG's 98, 139, 1724    NUTRITION - FOCUSED PHYSICAL EXAM:    Most Recent Value  Orbital Region  No depletion  Upper Arm Region  No depletion  Thoracic and Lumbar Region  No depletion  Buccal Region  No depletion  Temple Region  No depletion   Clavicle Bone Region  No depletion  Clavicle and Acromion Bone Region  No depletion  Scapular Bone Region  No depletion  Dorsal Hand  No depletion  Patellar Region  No depletion  Anterior Thigh Region  No depletion  Posterior Calf Region  No depletion  Edema (RD Assessment)  None  Hair  Reviewed  Eyes  Reviewed  Mouth  Reviewed  Skin  Reviewed  Nails  Reviewed       Diet Order:  Seizure precautions Diet Carb Modified Fluid consistency: Thin; Room service appropriate? Yes  EDUCATION NEEDS:   Not appropriate for education at this time  Skin:  Skin Assessment: Reviewed RN Assessment  Last BM:  2/11  Height:   Ht Readings from Last 1 Encounters:  05/16/17 4\' 11"  (1.499 m)    Weight:   Wt Readings from Last 1 Encounters:  05/18/17 82 lb 3.7 oz (37.3 kg)    Ideal Body Weight:  44.6 kg  BMI:  Body mass index is 16.61 kg/m.  Estimated Nutritional Needs:   Kcal:  1,300-1,500 kcal  Protein:  50-60 grams  Fluid:  1L + UOP    Alexis Skiffherie N. Lylie Blacklock, MS Dietetic Intern Pager: [phone]

## 2017-05-18 NOTE — Progress Notes (Signed)
[facility] Anticipated discharge date: 2/12 Family notified: pt sister Transportation by [facility]- scheduled at 3pm Report #: [phone]  CSW signing off.  Burna SisJenna H. Mitzy Naron, LCSW Clinical Social Worker [phone]

## 2017-05-18 NOTE — Progress Notes (Signed)
[facility] ROUNDING NOTE   Subjective:   Patient seen on dialysis no complaints  History of CVA and seizure disorder and diabetes  She was admitted with a seizure and was loaded with keppra and fosphenytoin and dilantin   She has been seizure free       Objective:  Vital signs in last 24 hours:  Temp:  [97.6 F (36.4 C)-98.4 F (36.9 C)] 98.4 F (36.9 C) (02/12 1100) Pulse Rate:  [83-103] 88 (02/12 0400) Resp:  [16-24] 16 (02/12 0400) BP: (100-131)/(59-74) 109/67 (02/12 0400) SpO2:  [94 %-99 %] 97 % (02/12 0400) Weight:  [82 lb 3.7 oz (37.3 kg)] 82 lb 3.7 oz (37.3 kg) (02/12 0449)  Weight change: 7.1 oz (0.2 kg) Filed Weights   05/17/17 0740 05/17/17 1120 05/18/17 0449  Weight: 80 lb 0.4 oz (36.3 kg) 78 lb 11.3 oz (35.7 kg) 82 lb 3.7 oz (37.3 kg)    Intake/Output: I/O last 3 completed shifts: In: 103 [P.O.:100; I.V.:3] Out: 0    Intake/Output this shift:  No intake/output data recorded.  Small AAF,alert, confused No jvd Chestclear bilat RRR tachy no MRG Abd soft ntnd Ext no edema R AVG +bruit     Basic Metabolic Panel: Recent Labs  Lab 05/12/17 1343 05/12/17 1351 05/13/17 0324 05/14/17 0641 05/15/17 0542 05/17/17 0413  NA 141 138 140 137 135 136  K 4.4 4.2 4.7 4.7 3.9 4.8  CL 95* 96* 94* 93* 90* 91*  CO2 29  --  27 21* 18* 21*  GLUCOSE 160* 157* 153* 132* 120* 287*  BUN 41* 38* 47* 26* 21* 58*  CREATININE 6.48* 6.40* 7.71* 5.35* 4.29* 8.80*  CALCIUM 9.8  --  8.9 9.0 9.5 9.5    Liver Function Tests: Recent Labs  Lab 05/12/17 1343 05/13/17 0324  AST 68* 52*  ALT 60* 48  ALKPHOS 128* 103  BILITOT 0.7 0.9  PROT 7.1 6.6  ALBUMIN 3.4* 3.2*   No results for input(s): LIPASE, AMYLASE in the last 168 hours. No results for input(s): AMMONIA in the last 168 hours.  CBC: Recent Labs  Lab 05/12/17 1343 05/12/17 1351 05/13/17 0324 05/14/17 0641 05/15/17 0542 05/17/17 0413  WBC 5.9  --  6.6 5.9 6.2 6.1  NEUTROABS 4.1  --    --   --   --   --   HGB 12.1 12.9 10.7* 11.0* 13.3 11.5*  HCT 37.1 38.0 34.1* 34.3* 40.1 35.3*  MCV 85.5  --  85.9 85.3 83.9 83.3  PLT 129*  --  109* 101* 134* 139*    Cardiac Enzymes: No results for input(s): CKTOTAL, CKMB, CKMBINDEX, TROPONINI in the last 168 hours.  BNP: Invalid input(s): POCBNP  CBG: Recent Labs  Lab 05/17/17 2322 05/18/17 0143 05/18/17 0507 05/18/17 0726 05/18/17 1135  GLUCAP 412* 182* 98 139* 172*    Microbiology: Results for orders placed or performed during the hospital encounter of 05/12/17  Culture, blood (Routine X 2) w Reflex to ID Panel     Status: None   Collection Time: 05/12/17  8:30 PM  Result Value Ref Range Status   Specimen Description BLOOD LEFT ARM  Final   Special Requests   Final    BOTTLES DRAWN AEROBIC AND ANAEROBIC Blood Culture adequate volume   Culture   Final    NO GROWTH 5 DAYS Performed at [facility], [address], [place], [place] [address]    Report Status 05/17/2017 FINAL  Final  Culture, blood (Routine  X 2) w Reflex to ID Panel     Status: None   Collection Time: 05/12/17  8:44 PM  Result Value Ref Range Status   Specimen Description BLOOD LEFT ARM  Final   Special Requests IN PEDIATRIC BOTTLE Blood Culture adequate volume  Final   Culture   Final    NO GROWTH 5 DAYS Performed at [facility], [address] [place], [place] [address]    Report Status 05/17/2017 FINAL  Final  Respiratory Panel by PCR     Status: None   Collection Time: 05/12/17  9:40 PM  Result Value Ref Range Status   Adenovirus NOT DETECTED NOT DETECTED Final   Coronavirus 229E NOT DETECTED NOT DETECTED Final   Coronavirus HKU1 NOT DETECTED NOT DETECTED Final   Coronavirus NL63 NOT DETECTED NOT DETECTED Final   Coronavirus OC43 NOT DETECTED NOT DETECTED Final   Metapneumovirus NOT DETECTED NOT DETECTED Final   Rhinovirus / Enterovirus NOT DETECTED NOT DETECTED Final   Influenza A NOT DETECTED NOT DETECTED Final    Influenza A H1 NOT DETECTED NOT DETECTED Final   Influenza A H1 2009 NOT DETECTED NOT DETECTED Final   Influenza A H3 NOT DETECTED NOT DETECTED Final   Influenza B NOT DETECTED NOT DETECTED Final   Parainfluenza Virus 1 NOT DETECTED NOT DETECTED Final   Parainfluenza Virus 2 NOT DETECTED NOT DETECTED Final   Parainfluenza Virus 3 NOT DETECTED NOT DETECTED Final   Parainfluenza Virus 4 NOT DETECTED NOT DETECTED Final   Respiratory Syncytial Virus NOT DETECTED NOT DETECTED Final   Bordetella pertussis NOT DETECTED NOT DETECTED Final   Chlamydophila pneumoniae NOT DETECTED NOT DETECTED Final   Mycoplasma pneumoniae NOT DETECTED NOT DETECTED Final    Comment: Performed at [facility], [address] [place], [place] [address]  MRSA PCR Screening     Status: None   Collection Time: 05/12/17  9:40 PM  Result Value Ref Range Status   MRSA by PCR NEGATIVE NEGATIVE Final    Comment:        The GeneXpert MRSA Assay (FDA approved for NASAL specimens only), is one component of a comprehensive MRSA colonization surveillance program. It is not intended to diagnose MRSA infection nor to guide or monitor treatment for MRSA infections. Performed at [facility], [address] [place], [place] [address]     Coagulation Studies: No results for input(s): LABPROT, INR in the last 72 hours.  Urinalysis: No results for input(s): COLORURINE, LABSPEC, PHURINE, GLUCOSEU, HGBUR, BILIRUBINUR, KETONESUR, PROTEINUR, UROBILINOGEN, NITRITE, LEUKOCYTESUR in the last 72 hours.  Invalid input(s): APPERANCEUR    Imaging: Mr Brain Wo Contrast  Result Date: 05/17/2017 CLINICAL DATA:  New onset seizure. EXAM: MRI HEAD WITHOUT CONTRAST TECHNIQUE: Multiplanar, multiecho pulse sequences of the brain and surrounding structures were obtained without intravenous contrast. COMPARISON:  Head CT 05/12/2017 FINDINGS: Brain: The midline structures are normal. There is no acute infarct or acute hemorrhage.  No mass lesion, hydrocephalus, dural abnormality or extra-axial collection. There is superior left frontal lobe encephalomalacia underlying the site of a remote burr hole craniotomy. There is also encephalomalacia of the left frontal operculum and left temporal lobe. There is diffuse cerebral and cerebellar atrophy with ex vacuo dilatation of the ventricles. There is markedly asymmetric volume loss of the left mesial temporal lobe with increased hyperintense T2-weighted signal and loss of normal hippocampal architecture. No chronic microhemorrhage or superficial siderosis. Vascular: Major intracranial arterial and venous sinus flow  voids are preserved. Skull and upper cervical spine: The visualized skull base, calvarium, upper cervical spine and extracranial soft tissues are normal. Sinuses/Orbits: No fluid levels or advanced mucosal thickening. No mastoid or middle ear effusion. Normal orbits. IMPRESSION: 1. No acute intracranial abnormality. 2. Multifocal encephalomalacia of the superior left frontal lobe, left frontal operculum and left temporal lobe with ex vacuo dilatation of the ventricles. 3. 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 Electronically Signed   By: Deatra Robinson M.D.   On: 05/17/2017 21:52     Medications:    . amLODipine  5 mg Oral Daily  . ferric citrate  840 mg Oral TID WC  . heparin  5,000 Units Subcutaneous Q8H  . insulin aspart  0-5 Units Subcutaneous QHS  . insulin aspart  0-9 Units Subcutaneous TID WC  . insulin glargine  10 Units Subcutaneous QHS  . levETIRAcetam  1,000 mg Oral Daily   And  . levETIRAcetam  500 mg Oral Q M,W,F-1800  . LORazepam  2 mg Intravenous Once  . mouth rinse  15 mL Mouth Rinse BID  . sodium chloride flush  3 mL Intravenous Q12H   [DISCONTINUED] acetaminophen **OR** acetaminophen, hydrALAZINE, [DISCONTINUED] ondansetron  **OR** ondansetron (ZOFRAN) IV  Assessment/ Plan:   Dialysis:[facility] MWF 3.5h F160 450/ 1.5 38kg 2/2 bath P4 RUE AVG Hep 1300 Hectorol IV TIW   Brain Wo Contrast  Result Date: 05/17/2017 IMPRESSION: 1. No acute intracranial abnormality. 2. Multifocal encephalomalacia of the superior left frontal lobe, left frontal operculum and left temporal lobe with ex vacuo dilatation of the ventricles. 3. 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

## 2017-05-18 NOTE — Progress Notes (Signed)
Patient discharged via [facility] to [facility]. Sister at the bedside. Personal belongings sent with patient. Patient on room air. Repport called to [place].

## 2017-11-12 ENCOUNTER — Encounter (HOSPITAL_COMMUNITY): Payer: Self-pay | Admitting: Emergency Medicine

## 2017-11-12 ENCOUNTER — Emergency Department (HOSPITAL_COMMUNITY): Payer: Medicaid Other

## 2017-11-12 ENCOUNTER — Inpatient Hospital Stay (HOSPITAL_COMMUNITY)
Admission: EM | Admit: 2017-11-12 | Discharge: 2017-12-05 | DRG: 064 | Disposition: E | Payer: Medicaid Other | Attending: Pulmonary Disease | Admitting: Pulmonary Disease

## 2017-11-12 DIAGNOSIS — N390 Urinary tract infection, site not specified: Secondary | ICD-10-CM | POA: Diagnosis not present

## 2017-11-12 DIAGNOSIS — R011 Cardiac murmur, unspecified: Secondary | ICD-10-CM | POA: Diagnosis present

## 2017-11-12 DIAGNOSIS — I615 Nontraumatic intracerebral hemorrhage, intraventricular: Secondary | ICD-10-CM | POA: Diagnosis present

## 2017-11-12 DIAGNOSIS — E1122 Type 2 diabetes mellitus with diabetic chronic kidney disease: Secondary | ICD-10-CM | POA: Diagnosis present

## 2017-11-12 DIAGNOSIS — Z992 Dependence on renal dialysis: Secondary | ICD-10-CM

## 2017-11-12 DIAGNOSIS — G40909 Epilepsy, unspecified, not intractable, without status epilepticus: Secondary | ICD-10-CM | POA: Diagnosis present

## 2017-11-12 DIAGNOSIS — J9601 Acute respiratory failure with hypoxia: Secondary | ICD-10-CM

## 2017-11-12 DIAGNOSIS — G9340 Encephalopathy, unspecified: Secondary | ICD-10-CM | POA: Diagnosis present

## 2017-11-12 DIAGNOSIS — I10 Essential (primary) hypertension: Secondary | ICD-10-CM | POA: Diagnosis not present

## 2017-11-12 DIAGNOSIS — R402114 Coma scale, eyes open, never, 24 hours or more after hospital admission: Secondary | ICD-10-CM | POA: Diagnosis present

## 2017-11-12 DIAGNOSIS — Z681 Body mass index (BMI) 19 or less, adult: Secondary | ICD-10-CM

## 2017-11-12 DIAGNOSIS — Z7189 Other specified counseling: Secondary | ICD-10-CM | POA: Diagnosis not present

## 2017-11-12 DIAGNOSIS — E11649 Type 2 diabetes mellitus with hypoglycemia without coma: Secondary | ICD-10-CM | POA: Diagnosis present

## 2017-11-12 DIAGNOSIS — Z66 Do not resuscitate: Secondary | ICD-10-CM | POA: Diagnosis not present

## 2017-11-12 DIAGNOSIS — Z86718 Personal history of other venous thrombosis and embolism: Secondary | ICD-10-CM

## 2017-11-12 DIAGNOSIS — I120 Hypertensive chronic kidney disease with stage 5 chronic kidney disease or end stage renal disease: Secondary | ICD-10-CM | POA: Diagnosis present

## 2017-11-12 DIAGNOSIS — R64 Cachexia: Secondary | ICD-10-CM | POA: Diagnosis present

## 2017-11-12 DIAGNOSIS — Z8673 Personal history of transient ischemic attack (TIA), and cerebral infarction without residual deficits: Secondary | ICD-10-CM

## 2017-11-12 DIAGNOSIS — D696 Thrombocytopenia, unspecified: Secondary | ICD-10-CM | POA: Diagnosis present

## 2017-11-12 DIAGNOSIS — D631 Anemia in chronic kidney disease: Secondary | ICD-10-CM | POA: Diagnosis present

## 2017-11-12 DIAGNOSIS — R509 Fever, unspecified: Secondary | ICD-10-CM | POA: Diagnosis present

## 2017-11-12 DIAGNOSIS — Z794 Long term (current) use of insulin: Secondary | ICD-10-CM | POA: Diagnosis not present

## 2017-11-12 DIAGNOSIS — F17210 Nicotine dependence, cigarettes, uncomplicated: Secondary | ICD-10-CM | POA: Diagnosis present

## 2017-11-12 DIAGNOSIS — I609 Nontraumatic subarachnoid hemorrhage, unspecified: Secondary | ICD-10-CM | POA: Diagnosis present

## 2017-11-12 DIAGNOSIS — R001 Bradycardia, unspecified: Secondary | ICD-10-CM | POA: Diagnosis not present

## 2017-11-12 DIAGNOSIS — I629 Nontraumatic intracranial hemorrhage, unspecified: Secondary | ICD-10-CM

## 2017-11-12 DIAGNOSIS — R402214 Coma scale, best verbal response, none, 24 hours or more after hospital admission: Secondary | ICD-10-CM | POA: Diagnosis present

## 2017-11-12 DIAGNOSIS — N186 End stage renal disease: Secondary | ICD-10-CM | POA: Diagnosis present

## 2017-11-12 DIAGNOSIS — E8889 Other specified metabolic disorders: Secondary | ICD-10-CM | POA: Diagnosis present

## 2017-11-12 DIAGNOSIS — G9341 Metabolic encephalopathy: Secondary | ICD-10-CM | POA: Insufficient documentation

## 2017-11-12 DIAGNOSIS — Z79899 Other long term (current) drug therapy: Secondary | ICD-10-CM | POA: Diagnosis not present

## 2017-11-12 DIAGNOSIS — N2581 Secondary hyperparathyroidism of renal origin: Secondary | ICD-10-CM | POA: Diagnosis present

## 2017-11-12 DIAGNOSIS — R402314 Coma scale, best motor response, none, 24 hours or more after hospital admission: Secondary | ICD-10-CM | POA: Diagnosis present

## 2017-11-12 DIAGNOSIS — B1920 Unspecified viral hepatitis C without hepatic coma: Secondary | ICD-10-CM | POA: Diagnosis present

## 2017-11-12 DIAGNOSIS — R000 Tachycardia, unspecified: Secondary | ICD-10-CM | POA: Diagnosis not present

## 2017-11-12 DIAGNOSIS — Z515 Encounter for palliative care: Secondary | ICD-10-CM | POA: Diagnosis not present

## 2017-11-12 DIAGNOSIS — G935 Compression of brain: Secondary | ICD-10-CM | POA: Diagnosis present

## 2017-11-12 DIAGNOSIS — Z789 Other specified health status: Secondary | ICD-10-CM

## 2017-11-12 LAB — CBC WITH DIFFERENTIAL/PLATELET
ABS IMMATURE GRANULOCYTES: 0 10*3/uL (ref 0.0–0.1)
BASOS ABS: 0 10*3/uL (ref 0.0–0.1)
BASOS PCT: 1 %
EOS ABS: 0 10*3/uL (ref 0.0–0.7)
Eosinophils Relative: 0 %
HCT: 37.7 % (ref 36.0–46.0)
Hemoglobin: 12.3 g/dL (ref 12.0–15.0)
Immature Granulocytes: 1 %
Lymphocytes Relative: 15 %
Lymphs Abs: 1 10*3/uL (ref 0.7–4.0)
MCH: 30.4 pg (ref 26.0–34.0)
MCHC: 32.6 g/dL (ref 30.0–36.0)
MCV: 93.1 fL (ref 78.0–100.0)
MONO ABS: 0.6 10*3/uL (ref 0.1–1.0)
Monocytes Relative: 10 %
NEUTROS ABS: 4.9 10*3/uL (ref 1.7–7.7)
NEUTROS PCT: 73 %
PLATELETS: 107 10*3/uL — AB (ref 150–400)
RBC: 4.05 MIL/uL (ref 3.87–5.11)
RDW: 13.2 % (ref 11.5–15.5)
WBC: 6.6 10*3/uL (ref 4.0–10.5)

## 2017-11-12 LAB — URINALYSIS, ROUTINE W REFLEX MICROSCOPIC
BILIRUBIN URINE: NEGATIVE
GLUCOSE, UA: 250 mg/dL — AB
KETONES UR: NEGATIVE mg/dL
Nitrite: NEGATIVE
Specific Gravity, Urine: 1.015 (ref 1.005–1.030)
pH: 8.5 — ABNORMAL HIGH (ref 5.0–8.0)

## 2017-11-12 LAB — COMPREHENSIVE METABOLIC PANEL
ALBUMIN: 3.8 g/dL (ref 3.5–5.0)
ALK PHOS: 132 U/L — AB (ref 38–126)
ALT: 42 U/L (ref 0–44)
ANION GAP: 15 (ref 5–15)
AST: 39 U/L (ref 15–41)
BUN: 40 mg/dL — ABNORMAL HIGH (ref 6–20)
CALCIUM: 9.6 mg/dL (ref 8.9–10.3)
CO2: 28 mmol/L (ref 22–32)
Chloride: 91 mmol/L — ABNORMAL LOW (ref 98–111)
Creatinine, Ser: 7.45 mg/dL — ABNORMAL HIGH (ref 0.44–1.00)
GFR calc non Af Amer: 5 mL/min — ABNORMAL LOW (ref >60)
GFR, EST AFRICAN AMERICAN: 6 mL/min — AB (ref >60)
Glucose, Bld: 248 mg/dL — ABNORMAL HIGH (ref 70–99)
POTASSIUM: 4.8 mmol/L (ref 3.5–5.1)
SODIUM: 134 mmol/L — AB (ref 135–145)
Total Bilirubin: 0.8 mg/dL (ref 0.3–1.2)
Total Protein: 7.2 g/dL (ref 6.5–8.1)

## 2017-11-12 LAB — URINALYSIS, MICROSCOPIC (REFLEX)

## 2017-11-12 LAB — GLUCOSE, CAPILLARY: Glucose-Capillary: 208 mg/dL — ABNORMAL HIGH (ref 70–99)

## 2017-11-12 LAB — I-STAT CG4 LACTIC ACID, ED
LACTIC ACID, VENOUS: 3.29 mmol/L — AB (ref 0.5–1.9)
Lactic Acid, Venous: 1.8 mmol/L (ref 0.5–1.9)

## 2017-11-12 LAB — PROTIME-INR
INR: 1.03
Prothrombin Time: 13.4 seconds (ref 11.4–15.2)

## 2017-11-12 MED ORDER — HEPARIN SODIUM (PORCINE) 1000 UNIT/ML DIALYSIS
1300.0000 [IU] | INTRAMUSCULAR | Status: DC | PRN
  Administered 2017-11-12: 1300 [IU] via INTRAVENOUS_CENTRAL
  Filled 2017-11-12 (×2): qty 2

## 2017-11-12 MED ORDER — ALTEPLASE 2 MG IJ SOLR: 2.0000 mg | Freq: Once | INTRAMUSCULAR | Status: DC | PRN

## 2017-11-12 MED ORDER — CHLORHEXIDINE GLUCONATE CLOTH 2 % EX PADS
6.0000 | MEDICATED_PAD | Freq: Every day | CUTANEOUS | Status: DC
  Administered 2017-11-13 – 2017-11-14 (×2): 6 via TOPICAL

## 2017-11-12 MED ORDER — RENA-VITE PO TABS
1.0000 | ORAL_TABLET | Freq: Every day | ORAL | Status: DC
  Administered 2017-11-13: 1 via ORAL
  Filled 2017-11-12: qty 1

## 2017-11-12 MED ORDER — AMLODIPINE BESYLATE 5 MG PO TABS
5.0000 mg | ORAL_TABLET | Freq: Every day | ORAL | Status: DC
  Administered 2017-11-13 – 2017-11-14 (×2): 5 mg via ORAL
  Filled 2017-11-12 (×3): qty 1

## 2017-11-12 MED ORDER — SODIUM CHLORIDE 0.9 % IV SOLN
1.0000 g | INTRAVENOUS | Status: DC
  Administered 2017-11-13: 1 g via INTRAVENOUS
  Filled 2017-11-12 (×2): qty 10

## 2017-11-12 MED ORDER — SODIUM CHLORIDE 0.9 % IV SOLN
1.0000 g | Freq: Once | INTRAVENOUS | Status: AC
  Administered 2017-11-12: 1 g via INTRAVENOUS
  Filled 2017-11-12: qty 10

## 2017-11-12 MED ORDER — HEPARIN SODIUM (PORCINE) 1000 UNIT/ML DIALYSIS
1000.0000 [IU] | INTRAMUSCULAR | Status: DC | PRN
  Filled 2017-11-12: qty 1

## 2017-11-12 MED ORDER — DOXERCALCIFEROL 4 MCG/2ML IV SOLN
2.0000 ug | INTRAVENOUS | Status: DC
  Filled 2017-11-12: qty 2

## 2017-11-12 MED ORDER — ACETAMINOPHEN 650 MG RE SUPP
650.0000 mg | Freq: Four times a day (QID) | RECTAL | Status: DC | PRN
  Administered 2017-11-13: 650 mg via RECTAL
  Filled 2017-11-12: qty 1

## 2017-11-12 MED ORDER — SODIUM CHLORIDE 0.9 % IV SOLN
100.0000 mL | INTRAVENOUS | Status: DC | PRN
Start: 2017-11-12 — End: 2017-11-15

## 2017-11-12 MED ORDER — LEVETIRACETAM 500 MG PO TABS
500.0000 mg | ORAL_TABLET | ORAL | Status: DC
  Administered 2017-11-12: 500 mg via ORAL
  Filled 2017-11-12: qty 1

## 2017-11-12 MED ORDER — TRAMADOL HCL 50 MG PO TABS: 50.0000 mg | ORAL_TABLET | Freq: Four times a day (QID) | ORAL | Status: DC | PRN

## 2017-11-12 MED ORDER — FERRIC CITRATE 1 GM 210 MG(FE) PO TABS
840.0000 mg | ORAL_TABLET | Freq: Three times a day (TID) | ORAL | Status: DC
  Administered 2017-11-13 – 2017-11-15 (×3): 840 mg via ORAL
  Filled 2017-11-12 (×11): qty 4

## 2017-11-12 MED ORDER — INSULIN ASPART 100 UNIT/ML ~~LOC~~ SOLN
0.0000 [IU] | Freq: Three times a day (TID) | SUBCUTANEOUS | Status: DC
  Administered 2017-11-12: 3 [IU] via SUBCUTANEOUS
  Administered 2017-11-13 (×2): 5 [IU] via SUBCUTANEOUS
  Administered 2017-11-13: 9 [IU] via SUBCUTANEOUS

## 2017-11-12 MED ORDER — LEVETIRACETAM 500 MG PO TABS
1000.0000 mg | ORAL_TABLET | Freq: Every day | ORAL | Status: DC
  Administered 2017-11-12 – 2017-11-13 (×2): 1000 mg via ORAL
  Filled 2017-11-12 (×2): qty 2

## 2017-11-12 MED ORDER — HEPARIN SODIUM (PORCINE) 5000 UNIT/ML IJ SOLN
5000.0000 [IU] | Freq: Three times a day (TID) | INTRAMUSCULAR | Status: DC
  Administered 2017-11-13 (×3): 5000 [IU] via SUBCUTANEOUS
  Filled 2017-11-12 (×3): qty 1

## 2017-11-12 MED ORDER — ACETAMINOPHEN 325 MG PO TABS
650.0000 mg | ORAL_TABLET | Freq: Four times a day (QID) | ORAL | Status: DC | PRN
  Administered 2017-11-13: 650 mg via ORAL
  Filled 2017-11-12 (×2): qty 2

## 2017-11-12 MED ORDER — ONDANSETRON HCL 4 MG PO TABS: 4.0000 mg | ORAL_TABLET | Freq: Four times a day (QID) | ORAL | Status: DC | PRN

## 2017-11-12 MED ORDER — LIDOCAINE HCL (PF) 1 % IJ SOLN: 5.0000 mL | INTRAMUSCULAR | Status: DC | PRN

## 2017-11-12 MED ORDER — LIDOCAINE-PRILOCAINE 2.5-2.5 % EX CREA
1.0000 "application " | TOPICAL_CREAM | CUTANEOUS | Status: DC | PRN
  Filled 2017-11-12: qty 5

## 2017-11-12 MED ORDER — ONDANSETRON HCL 4 MG/2ML IJ SOLN
4.0000 mg | Freq: Four times a day (QID) | INTRAMUSCULAR | Status: DC | PRN
Start: 2017-11-12 — End: 2017-11-14

## 2017-11-12 MED ORDER — PENTAFLUOROPROP-TETRAFLUOROETH EX AERO: 1.0000 "application " | INHALATION_SPRAY | CUTANEOUS | Status: DC | PRN

## 2017-11-12 MED ORDER — POLYETHYLENE GLYCOL 3350 17 G PO PACK: 17.0000 g | PACK | Freq: Every day | ORAL | Status: DC | PRN

## 2017-11-12 NOTE — ED Notes (Signed)
Attempted report X1

## 2017-11-12 NOTE — ED Notes (Signed)
Pt transported to radiology.

## 2017-11-12 NOTE — Consult Note (Addendum)
[facility] Renal Consultation Note    Indication for Consultation:  Management of ESRD/hemodialysis; anemia, hypertension/volume and secondary hyperparathyroidism  UJW:JXBJYNW, Dorma Russell, MD  HPI: Alexis Sims is a 57 y.o. female. ESRD 2/2 DM/HTN on HD MWF at [facility]. Past medical history significant for DM, seizure disorder, Hep C, Hx CVA, Hx traumatic brain injury, and hx cocaine abuse.   Patient presented to the ED with her caregiver via EMS due to AMS and fever.  Seen and examined at bedside.  Patient unable to provide history d/t current mental status.  Answered yes and no to few questions occasionally but for the most part remained silent and would not follow commands.  Family no longer at bedside.  Review of ED notes indicates family reported hypogylcemic episode last night with increased confusion since yesterday.  Last dialysis was on 8/7 per regular schedule.   Pertinent findings in the ED included unremarkable CXR, lactic acid 3.29, Tmax 102.54F, and UA indicating infection.  Patient was admitted for further evaluation and management.  Past Medical History:  Diagnosis Date  . Blood clots in brain   . CKD (chronic kidney disease) stage V requiring chronic dialysis (HCC)   . Diabetes mellitus type 2, insulin dependent (HCC)   . Gait disturbance, post-stroke   . History of CVA (cerebrovascular accident)   . HTN (hypertension)   . Seizures (HCC)    Past Surgical History:  Procedure Laterality Date  . AV FISTULA PLACEMENT    . BURR HOLE     No family history on file. Social History:  reports that she has been smoking cigarettes and pipe. She has been smoking about 0.50 packs per day. She has never used smokeless tobacco. She reports that she does not drink alcohol or use drugs. No Known Allergies Prior to Admission medications   Medication Sig Start Date End Date Taking? Authorizing Provider  amLODipine (NORVASC) 5 MG tablet Take 5 mg by mouth daily.  04/22/17  Yes  [provider]  AURYXIA 1 GM 210 MG(Fe) tablet Take 840 mg by mouth 3 (three) times daily with meals. 05/10/17  Yes [provider]  LANTUS SOLOSTAR 100 UNIT/ML Solostar Pen Inject 10 Units into the skin at bedtime. Patient taking differently: Inject 16 Units into the skin at bedtime.  05/18/17  Yes Calvert Cantor, MD  levETIRAcetam (KEPPRA) 1000 MG tablet Take 1 tablet (1,000 mg total) by mouth daily. 05/19/17  Yes Calvert Cantor, MD  levETIRAcetam (KEPPRA) 500 MG tablet Take 1 tablet (500 mg total) by mouth every Monday, Wednesday, and Friday at 6 PM. 05/19/17  Yes Calvert Cantor, MD   Current Facility-Administered Medications  Medication Dose Route Frequency Provider Last Rate Last Dose  . acetaminophen (TYLENOL) tablet 650 mg  650 mg Oral Q6H PRN Lahoma Crocker, MD       Or  . acetaminophen (TYLENOL) suppository 650 mg  650 mg Rectal Q6H PRN Lahoma Crocker, MD      . amLODipine (NORVASC) tablet 5 mg  5 mg Oral Daily Lahoma Crocker, MD      . Melene Muller ON 11/13/2017] cefTRIAXone (ROCEPHIN) 1 g in sodium chloride 0.9 % 100 mL IVPB  1 g Intravenous Q24H Lahoma Crocker, MD      . Melene Muller ON 11/13/2017] Chlorhexidine Gluconate Cloth 2 % PADS 6 each  6 each Topical Q0600 Penninger, Lindsay, PA      . ferric citrate (AURYXIA) tablet 840 mg  840 mg Oral TID WC Brett Canales  C, MD      . heparin injection 5,000 Units  5,000 Units Subcutaneous Q8H Lahoma Crocker, MD      . insulin aspart (novoLOG) injection 0-9 Units  0-9 Units Subcutaneous TID WC Lahoma Crocker, MD      . levETIRAcetam (KEPPRA) tablet 1,000 mg  1,000 mg Oral Daily Lahoma Crocker, MD      . levETIRAcetam (KEPPRA) tablet 500 mg  500 mg Oral Q M,W,F-1800 Lahoma Crocker, MD      . ondansetron [facility]) tablet 4 mg  4 mg Oral Q6H PRN Lahoma Crocker, MD       Or  . ondansetron [facility]) injection 4 mg  4 mg Intravenous Q6H PRN Lahoma Crocker, MD      . polyethylene glycol (MIRALAX / GLYCOLAX)  packet 17 g  17 g Oral Daily PRN Lahoma Crocker, MD      . traMADol Janean Sark) tablet 50 mg  50 mg Oral Q6H PRN Lahoma Crocker, MD       Current Outpatient Medications  Medication Sig Dispense Refill  . amLODipine (NORVASC) 5 MG tablet Take 5 mg by mouth daily.   5  . AURYXIA 1 GM 210 MG(Fe) tablet Take 840 mg by mouth 3 (three) times daily with meals.  11  . LANTUS SOLOSTAR 100 UNIT/ML Solostar Pen Inject 10 Units into the skin at bedtime. (Patient taking differently: Inject 16 Units into the skin at bedtime. ) 15 mL 5  . levETIRAcetam (KEPPRA) 1000 MG tablet Take 1 tablet (1,000 mg total) by mouth daily.    [name] levETIRAcetam (KEPPRA) 500 MG tablet Take 1 tablet (500 mg total) by mouth every Monday, Wednesday, and Friday at 6 PM.     Labs: Basic Metabolic Panel: Recent Labs  Lab 11/17/2017 1330  NA 134*  K 4.8  CL 91*  CO2 28  GLUCOSE 248*  BUN 40*  CREATININE 7.45*  CALCIUM 9.6   Liver Function Tests: Recent Labs  Lab 11/18/2017 1330  AST 39  ALT 42  ALKPHOS 132*  BILITOT 0.8  PROT 7.2  ALBUMIN 3.8   CBC: Recent Labs  Lab 12/02/2017 1237  WBC 6.6  NEUTROABS 4.9  HGB 12.3  HCT 37.7  MCV 93.1  PLT 107*   Studies/Results: Dg Chest 2 View  Result Date: 11/11/2017 CLINICAL DATA:  57 year old female with a history altered mental status EXAM: CHEST - 2 VIEW COMPARISON:  05/14/2017 FINDINGS: Cardiomediastinal silhouette unchanged. No central vascular congestion. No interlobular septal thickening. Improved aeration compared to the prior with resolution of airspace and interstitial opacities. No confluent airspace disease. No pneumothorax or pleural effusion. No displaced fracture. Vascular stents of the right upper extremity. IMPRESSION: Negative acute cardiopulmonary disease. Electronically Signed   By: Gilmer Mor D.O.   On: 11/05/2017 12:27    ROS: Unable to complete due to AMS   Physical Exam: Vitals:   11/21/2017 1600 11/19/2017 1615 11/17/2017 1637 11/04/2017 1646   BP: (!) 146/75   (!) 143/65  Pulse:  86  92  Resp:    18  Temp:   (!) 100.5 F (38.1 C)   TempSrc:      SpO2:  96%  98%  Weight:      Height:         General:  NAD, thin, frail chronically ill appearing female Head: NCAT +sclera icterics,  MMM Neck: Supple. No lymphadenopathy Lungs: CTA bilaterally. No wheeze, rales or rhonchi. Breathing is unlabored. Heart:  RRR. No murmur, rubs or gallops.  Abdomen: soft, nontender, +BS, no guarding, no rebound tenderness Lower extremities:no edema, ischemic changes, or open wounds  Neuro: alert. Moves all extremities spontaneously. Dialysis Access: RUE AVG +b/t  Dialysis Orders:  MWF - [facility]  3.5hrs, BFR 450, DFR AF1.5,  EDW 40.5kg, 2K/ 2Ca  Access: RU AVG  Heparin 1300 Unit bolus Hectorol 2mcg IV qHD    Assessment/Plan: 1.  AMS - known Hx traumatic brain injury - not at baseline per family.  Likely d/t UTI - UC pending, febrile, lactic acid 3.29. WBC normal. Per primary 2. Fever - likely d/t UTI - on ABX  3. ESRD -  HD MWF. Orders written for HD today per regular schedule.  4.  Hypertension/volume  - BP^, does not appear volume overloaded on exam.  Titrate down as tolerated.  5.  Anemia of CKD - Hgb 12.3. No indication for ESA at this time 6.  Metabolic bone disease - Ca 9.6. Ordered phos. Continue VDRA, binders.  7.  Nutrition - Alb 3.8. Renal diet w/fluid restrictions. Renavite.  8. Seizure disorder - on keppra. per primary 9. DMT2 - BS ^. On insulin. Per primary. 10. Hep C - per primary 11. Hx CVA  Virgina NorfolkLindsay Penninger, PA-C [facility] Pager: [phone] 11/24/2017, 4:55 PM   Pt seen, examined and agree w A/P as above.  Vinson Moselleob Onofre Gains MD [facility] pager [phone]   11/30/2017, 7:46 AM

## 2017-11-12 NOTE — Progress Notes (Signed)
HD tx initiated via 15Gx2 w/o problem, pull/push/flush well w/o problem, VSS, will cont to monitor while on HD tx 

## 2017-11-12 NOTE — ED Notes (Signed)
Blood collected by IV team. 

## 2017-11-12 NOTE — ED Notes (Signed)
Phlebotomy at bedside.

## 2017-11-12 NOTE — ED Notes (Signed)
Attempted IV access X2, unable to advance catheter. IV team consult placed.

## 2017-11-12 NOTE — H&P (Signed)
History and Physical    Alexis Sims NWG:[id number] DOB: [birth year] DOA: 12/01/2017  PCP: Alexis Sims   Patient coming from: Home  I have personally briefly reviewed patient[facility]  Chief Complaint: Confusion and altered mental status  HPI: Alexis Sims is a 57 y.o. 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  Although alert and awake is unable to provide any useful history.  She has been confused since last night per conversation with her daughter by Dr. Rubin PayorPickering in the emergency department.  She had a sugar down to 29 last night EMS was called and gave her some glucagon and her sugars came up.  However her mental status remained confused.  Today she is found to have a fever on presentation.  She is a dialysis patient she is dialyzed Monday Wednesday and Friday and has not yet been dialyzed today.  Is Friday.  Her confusion worsened and her family members felt that this is not her baseline.  The patient cannot provide any history. ED Course: ALT 102.2, much more confused than at baseline, blood cell count normal, GFR is 6, Alysis found to be consistent with infection.  Patient referred to [facility] for admission.  Review of Systems: Patient unable due to confusion and delirium  Past Medical History:  Diagnosis Date  . Blood clots in brain   . CKD (chronic kidney disease) stage V requiring chronic dialysis (HCC)   . Diabetes mellitus type 2, insulin dependent (HCC)   . Gait disturbance, post-stroke   . History of CVA (cerebrovascular accident)   . HTN (hypertension)   . Seizures (HCC)     Past Surgical History:  Procedure Laterality Date  . AV FISTULA PLACEMENT    . BURR HOLE      Social History   Social History Narrative  . Not on file     reports that she  has been smoking cigarettes and pipe. She has been smoking about 0.50 packs per day. She has never used smokeless tobacco. She reports that she does not drink alcohol or use drugs.  No Known Allergies  No family history on file. Patient is unable to provide any family history of present  Prior to Admission medications   Medication Sig Start Date End Date Taking? Authorizing Provider  amLODipine (NORVASC) 5 MG tablet Take 5 mg by mouth daily.  04/22/17  Yes Provider, Historical, Sims  AURYXIA 1 GM 210 MG(Fe) tablet Take 840 mg by mouth 3 (three) times daily with meals. 05/10/17  Yes Provider, Historical, Sims  LANTUS SOLOSTAR 100 UNIT/ML Solostar Pen Inject 10 Units into the skin at bedtime. Patient taking differently: Inject 16 Units into the skin at bedtime.  05/18/17  Yes Calvert Cantorizwan, Saima, Sims  levETIRAcetam (KEPPRA) 1000 MG tablet Take 1 tablet (1,000 mg total) by mouth daily. 05/19/17  Yes Calvert Cantorizwan, Saima, Sims  levETIRAcetam (KEPPRA) 500 MG tablet Take 1 tablet (500 mg total) by mouth every Monday, Wednesday, and Friday at 6 PM. 05/19/17  Yes Calvert Cantorizwan, Saima, Sims    Physical Exam:  Constitutional: Sitting up slightly agitated, confused, garrulous Vitals:   11/20/2017 1419 11/08/2017 1600 12/04/2017 1615 11/05/2017 1637  BP: (!) 141/72 (!) 146/75    Pulse:   86   Resp: 19     Temp:    (!) 100.5 F (38.1 C)  TempSrc:      SpO2: 100%  96%   Weight:      Height:       Eyes: PERRL, lids and conjunctivae normal ENMT: Mucous membranes are dry posterior pharynx clear of any exudate or lesions.Normal dentition.  Neck: normal, supple, no masses, no thyromegaly Respiratory: clear to auscultation bilaterally, no wheezing, no crackles. Normal respiratory effort. No accessory muscle use.  Cardiovascular: Regular rate and rhythm, no murmurs / rubs / gallops. No extremity edema. 2+ pedal pulses. No carotid bruits.  Abdomen: no tenderness, no masses palpated. No hepatosplenomegaly. Bowel sounds positive.    Musculoskeletal: no clubbing / cyanosis. No joint deformity upper and lower extremities. Good ROM, no contractures. Normal muscle tone.  Skin: no rashes, lesions, ulcers. No induration, hot Neurologic: Occult he following commands Psychiatric: Alert and oriented to name only.  Unable to follow commands.  Labs on Admission: I have personally reviewed following labs and imaging studies  CBC: Recent Labs  Lab 11/04/2017 1237  WBC 6.6  NEUTROABS 4.9  HGB 12.3  HCT 37.7  MCV 93.1  PLT 107*   Basic Metabolic Panel: Recent Labs  Lab 11/26/2017 1330  NA 134*  K 4.8  CL 91*  CO2 28  GLUCOSE 248*  BUN 40*  CREATININE 7.45*  CALCIUM 9.6   GFR: Estimated Creatinine Clearance: 4.9 mL/min (A) (by C-G formula based on SCr of 7.45 mg/dL (H)). Liver Function Tests: Recent Labs  Lab 11/14/2017 1330  AST 39  ALT 42  ALKPHOS 132*  BILITOT 0.8  PROT 7.2  ALBUMIN 3.8   Coagulation Profile: Recent Labs  Lab 11/19/2017 1330  INR 1.03  Urine analysis:    Component Value Date/Time   COLORURINE YELLOW 11/04/2017 1420   APPEARANCEUR CLOUDY (A) 11/17/2017 1420   LABSPEC 1.015 11/04/2017 1420   PHURINE 8.5 (H) 11/09/2017 1420   GLUCOSEU 250 (A) 11/08/2017 1420   HGBUR LARGE (A) 11/17/2017 1420   BILIRUBINUR NEGATIVE 12/03/2017 1420   KETONESUR NEGATIVE 11/14/2017 1420   PROTEINUR >300 (A) 11/19/2017 1420   NITRITE NEGATIVE 11/21/2017 1420   LEUKOCYTESUR MODERATE (A) 12/01/2017 1420    Radiological Exams on Admission: Dg Chest 2 View  Result Date: 11/21/2017 CLINICAL DATA:  57 year old female with a history altered mental status EXAM: CHEST - 2 VIEW COMPARISON:  05/14/2017 FINDINGS: Cardiomediastinal silhouette unchanged. No central vascular congestion. No interlobular septal thickening. Improved aeration compared to the prior with resolution of airspace and interstitial opacities. No confluent airspace disease. No pneumothorax or pleural effusion. No displaced fracture. Vascular stents  of the right upper extremity. IMPRESSION: Negative acute cardiopulmonary disease. Electronically Signed   By: Gilmer Mor D.O.   On: 11/10/2017 12:27    EKG: Independently reviewed.  Sinus rhythm with an interventricular conduction delay unchanged from prior  Assessment/Plan Principal Problem:   Acute metabolic encephalopathy Active Problems:   Fever   UTI (urinary tract infection)   CKD (chronic kidney disease) stage V requiring chronic dialysis (HCC)   Type 2 diabetes mellitus with hypoglycemia without coma (HCC)   History of CVA (cerebrovascular accident)   HTN (hypertension)   Thrombocytopenia (HCC)  1.  Acute metabolic encephalopathy: Likely due to fever and urinary tract infection.  Patient has received some IV antibiotics in the form of Rocephin.  Urine is being cultured.  We will continue antibiotics and await culture data.  2.  Fever: Likely related to acute urinary tract infection will allow patient to drink fluids readily.  3.  Urinary tract  infection: Awaiting culture data.  Continue Rocephin.  4.  Chronic kidney disease stage V requiring chronic hemodialysis: Patient missed hemodialysis today.  Nephrology has been notified  5.  Diabetes mellitus type II with hypoglycemia but without coma: With hypoglycemia yesterday today her sugar is 102 and improved but she still very confused.  We will hold her home insulin dosing and check fingerstick blood glucoses and cover her with sliding scale.  Her infection may have caused her develop increased hypoglycemia.  Will monitor closely.  6.  History of stroke: Noted.  7.  Hypertension: Continue home medications.  8.  Thrombocytopenia: This is chronic.  Noted.    DVT prophylaxis: Subcu heparin Code Status: Prior CODE STATUS was DNR.  Patient is confused and no family is here to discuss CODE STATUS.  For now we will keep her a full code Family Communication: No one present at admission.  Dr. Rubin Payor spoke with patient's  daughter earlier. Disposition Plan: Likely home in 3 to 4 days Consults called: Nephrology Admission status: Inpatient   Lahoma Crocker Sims FACP [facility] Pager [phone]  If 7PM-7AM, please contact night-coverage [email or web address] Password [facility]  11/14/2017, 4:47 PM

## 2017-11-12 NOTE — ED Provider Notes (Signed)
[facility] EMERGENCY DEPARTMENT Provider Note   CSN: [id number] Arrival date & time: 12/12/2017  1129     History   Chief Complaint Chief Complaint  Patient presents with  . Altered Mental Status    HPI Alexis Sims is a 57 y.o. female. Level 5 caveat due to altered mental status. HPI Patient presents with mental status change and fever.  Has been more confused last night.  Reportedly had sugar go down to 29 last night but is been more elevated since.  Found to have fever here.  She is a dialysis patient dialyzed Monday Wednesday Friday and has not been dialyzed today, which is Friday.  Has been more confused.  Per family members here this is not her baseline.  Patient really cannot provide any history. Past Medical History:  Diagnosis Date  . Blood clots in brain   . CKD (chronic kidney disease) stage V requiring chronic dialysis (HCC)   . Diabetes mellitus type 2, insulin dependent (HCC)   . Gait disturbance, post-stroke   . History of CVA (cerebrovascular accident)   . HTN (hypertension)   . Seizures [facility])     Patient Active Problem List   Diagnosis Date Noted  . Status epilepticus (HCC) 05/12/2017  . CKD (chronic kidney disease) stage V requiring chronic dialysis (HCC) 05/12/2017  . History of CVA (cerebrovascular accident) 05/12/2017  . Acute hyperglycemia 05/12/2017  . HTN (hypertension) 05/12/2017  . Gait disturbance, post-stroke 05/12/2017  . Diabetes mellitus type 2, insulin dependent (HCC) 05/12/2017  . Transaminitis 05/12/2017  . Thrombocytopenia (HCC) 05/12/2017  . Sinus tachycardia 05/12/2017  . Hyperkalemia 06/01/2015    Past Surgical History:  Procedure Laterality Date  . AV FISTULA PLACEMENT    . BURR HOLE       OB History   None      Home Medications    Prior to Admission medications   Medication Sig Start Date End Date Taking? Authorizing Provider  amLODipine (NORVASC) 5 MG tablet Take 5 mg by mouth daily.  04/22/17  Yes  [provider]  AURYXIA 1 GM 210 MG(Fe) tablet Take 840 mg by mouth 3 (three) times daily with meals. 05/10/17  Yes [provider]  LANTUS SOLOSTAR 100 UNIT/ML Solostar Pen Inject 10 Units into the skin at bedtime. Patient taking differently: Inject 16 Units into the skin at bedtime.  05/18/17  Yes Calvert Cantor, MD  levETIRAcetam (KEPPRA) 1000 MG tablet Take 1 tablet (1,000 mg total) by mouth daily. 05/19/17  Yes Calvert Cantor, MD  levETIRAcetam (KEPPRA) 500 MG tablet Take 1 tablet (500 mg total) by mouth every Monday, Wednesday, and Friday at 6 PM. 05/19/17  Yes Calvert Cantor, MD    Family History No family history on file.  Social History Social History   Tobacco Use  . Smoking status: Current Every Day Smoker    Packs/day: 0.50    Types: Cigarettes, Pipe  . Smokeless tobacco: Never Used  Substance Use Topics  . Alcohol use: No  . Drug use: No     Allergies   Patient has no known allergies.   Review of Systems Review of Systems  Unable to perform ROS: Mental status change     Physical Exam Updated Vital Signs BP (!) 141/72   Pulse 85   Temp (!) 102.2 F (39 C) (Oral)   Resp 19   Ht 5\' 2"  (1.575 m)   Wt 37 kg   SpO2 100%   BMI 14.92 kg/m  Physical Exam  Constitutional: She appears well-developed.  HENT:  Head: Atraumatic.  Eyes: EOM are normal.  Neck: Neck supple.  Cardiovascular: Normal rate.  Abdominal: There is no tenderness.  Musculoskeletal:  Dialysis graft right upper extremity with thrill.  Neurological: She is alert.  Patient is alert and confused.  Will answer yes or no but does not appear to be appropriate with her answers.  Moving all extremities.  Skin: Skin is warm. Capillary refill takes less than 2 seconds.     ED Treatments / Results  Labs (all labs ordered are listed, but only abnormal results are displayed) Labs Reviewed  CBC WITH DIFFERENTIAL/PLATELET - Abnormal; Notable for the following components:      Result  Value   Platelets 107 (*)    All other components within normal limits  URINALYSIS, ROUTINE W REFLEX MICROSCOPIC - Abnormal; Notable for the following components:   APPearance CLOUDY (*)    pH 8.5 (*)    Glucose, UA 250 (*)    Hgb urine dipstick LARGE (*)    Protein, ur >300 (*)    Leukocytes, UA MODERATE (*)    All other components within normal limits  COMPREHENSIVE METABOLIC PANEL - Abnormal; Notable for the following components:   Sodium 134 (*)    Chloride 91 (*)    Glucose, Bld 248 (*)    BUN 40 (*)    Creatinine, Ser 7.45 (*)    Alkaline Phosphatase 132 (*)    GFR calc non Af Amer 5 (*)    GFR calc Af Amer 6 (*)    All other components within normal limits  URINALYSIS, MICROSCOPIC (REFLEX) - Abnormal; Notable for the following components:   Bacteria, UA RARE (*)    All other components within normal limits  CULTURE, BLOOD (ROUTINE X 2)  CULTURE, BLOOD (ROUTINE X 2)  URINE CULTURE  PROTIME-INR  I-STAT CG4 LACTIC ACID, ED  I-STAT CG4 LACTIC ACID, ED    EKG EKG Interpretation  Date/Time:  Friday November 12 2017 11:38:57 EDT Ventricular Rate:  81 PR Interval:    QRS Duration: 112 QT Interval:  358 QTC Calculation: 416 R Axis:   67 Text Interpretation:  Sinus rhythm Borderline intraventricular conduction delay Confirmed by Benjiman CorePickering, Kaliyah Gladman [phone]([id number]) on 11/05/2017 12:25:00 PM   Radiology Dg Chest 2 View  Result Date: 11/19/2017 CLINICAL DATA:  57 year old female with a history altered mental status EXAM: CHEST - 2 VIEW COMPARISON:  05/14/2017 FINDINGS: Cardiomediastinal silhouette unchanged. No central vascular congestion. No interlobular septal thickening. Improved aeration compared to the prior with resolution of airspace and interstitial opacities. No confluent airspace disease. No pneumothorax or pleural effusion. No displaced fracture. Vascular stents of the right upper extremity. IMPRESSION: Negative acute cardiopulmonary disease. Electronically Signed   By: Gilmer MorJaime   Wagner D.O.   On: 11/09/2017 12:27    Procedures Procedures (including critical care time)  Medications Ordered in ED Medications  cefTRIAXone (ROCEPHIN) 1 g in sodium chloride 0.9 % 100 mL IVPB (has no administration in time range)     Initial Impression / Assessment and Plan / ED Course  I have reviewed the triage vital signs and the nursing notes.  Pertinent labs & imaging results that were available during my care of the patient were reviewed by me and considered in my medical decision making (see chart for details).     Patient with mental status changes.  Fever of 102.2.  Normal lactic acid.  Urine shows potential infection.  Culture sent.  X-ray reassuring.  Is a dialysis patient was not dialyzed today.  Will admit to hospitalist.  Final Clinical Impressions(s) / ED Diagnoses   Final diagnoses:  Fever, unspecified fever cause  Encephalopathy  End stage renal disease on dialysis [facility])  Urinary tract infection without hematuria, site unspecified    ED Discharge Orders    None       Benjiman Core, MD 12-07-17 1527

## 2017-11-12 NOTE — ED Triage Notes (Signed)
Arrived by EMS caretaker called for patient having alter mental status yesterday continued today.  Normally ax2 and intermittently stares and does not talk when talked to. Caretaker concerned patient not talking much.  Does follow commands appropriate.

## 2017-11-13 DIAGNOSIS — N390 Urinary tract infection, site not specified: Secondary | ICD-10-CM

## 2017-11-13 DIAGNOSIS — I10 Essential (primary) hypertension: Secondary | ICD-10-CM

## 2017-11-13 LAB — COMPREHENSIVE METABOLIC PANEL
ALBUMIN: 3.4 g/dL — AB (ref 3.5–5.0)
ALK PHOS: 105 U/L (ref 38–126)
ALT: 34 U/L (ref 0–44)
ANION GAP: 19 — AB (ref 5–15)
AST: 32 U/L (ref 15–41)
BUN: 23 mg/dL — ABNORMAL HIGH (ref 6–20)
CALCIUM: 9.5 mg/dL (ref 8.9–10.3)
CO2: 25 mmol/L (ref 22–32)
Chloride: 93 mmol/L — ABNORMAL LOW (ref 98–111)
Creatinine, Ser: 4.06 mg/dL — ABNORMAL HIGH (ref 0.44–1.00)
GFR calc non Af Amer: 11 mL/min — ABNORMAL LOW (ref >60)
GFR, EST AFRICAN AMERICAN: 13 mL/min — AB (ref >60)
GLUCOSE: 274 mg/dL — AB (ref 70–99)
POTASSIUM: 3.6 mmol/L (ref 3.5–5.1)
SODIUM: 137 mmol/L (ref 135–145)
Total Bilirubin: 1.4 mg/dL — ABNORMAL HIGH (ref 0.3–1.2)
Total Protein: 7.1 g/dL (ref 6.5–8.1)

## 2017-11-13 LAB — PHOSPHORUS: Phosphorus: 5 mg/dL — ABNORMAL HIGH (ref 2.5–4.6)

## 2017-11-13 LAB — GLUCOSE, CAPILLARY
GLUCOSE-CAPILLARY: 135 mg/dL — AB (ref 70–99)
GLUCOSE-CAPILLARY: 353 mg/dL — AB (ref 70–99)
Glucose-Capillary: 256 mg/dL — ABNORMAL HIGH (ref 70–99)
Glucose-Capillary: 293 mg/dL — ABNORMAL HIGH (ref 70–99)

## 2017-11-13 LAB — MRSA PCR SCREENING: MRSA BY PCR: NEGATIVE

## 2017-11-13 LAB — HIV ANTIBODY (ROUTINE TESTING W REFLEX): HIV Screen 4th Generation wRfx: NONREACTIVE

## 2017-11-13 MED ORDER — HYDRALAZINE HCL 20 MG/ML IJ SOLN
10.0000 mg | Freq: Once | INTRAMUSCULAR | Status: AC
  Administered 2017-11-13: 10 mg via INTRAVENOUS
  Filled 2017-11-13: qty 1

## 2017-11-13 NOTE — Progress Notes (Signed)
Pt off to dialysis.

## 2017-11-13 NOTE — Progress Notes (Signed)
Back from dialysis. Tylenol given for temp 100.3. Tolerated well. Will monitor pt.

## 2017-11-13 NOTE — Progress Notes (Signed)
[facility]  PROGRESS NOTE  Alexis ChengDora Sims ZOX:[id number] DOB: 11-Apr-1960 DOA: 11/07/2017 PCP: Fleet ContrasAvbuere, Edwin, MD   Brief HPI:   57 year old female with history of CKD stage V on hemodialysis, type 2 diabetes mellitus, history of stroke, hypertension who was brought to hospital after she has been confused.  As per patient's sister her blood glucose had dropped to 29 last night and EMS was called and was given glucagon.  Patient blood sugar improved but after that she remained confused.  Also patient found to have fever.  Patient started on IV ceftriaxone for possible UTI.    Subjective   Patient seen and examined, nonverbal.  Not following commands.  No she is awake and alert.   Assessment/Plan:     1. Acute encephalopathy-likely from UTI, patient started on IV Rocephin.  Urine culture has been ordered.  Also obtain MRI  brain to rule out CVA. 2. Fever-likely from UTI, chest x-ray is clear.  Lactic acid is 3.29, WBC 6.6. 3. ESRD-on hemodialysis, nephrology has been consulted 4. Diabetes mellitus type 2-patient had episode of hypoglycemia, which is currently resolved.  Patient continues to have altered mental status.  Will monitor. 5. Hypertension-blood pressure stable 6. Thrombocytopenia-chronic, platelet count 107. 7. Seizure disorder-continue Keppra    DVT prophylaxis:   Code Status: Full code  Family Communication: No family at bedside  Disposition Plan: likely home when medically ready for discharge   Consultants:  Nephrology  Procedures:  None   Antibiotics:   Anti-infectives (From admission, onward)   Start     Dose/Rate Route Frequency Ordered Stop   11/13/17 1000  cefTRIAXone (ROCEPHIN) 1 g in sodium chloride 0.9 % 100 mL IVPB     1 g 200 mL/hr over 30 Minutes Intravenous Every 24 hours 11/24/2017 1624     11/04/2017 1530  cefTRIAXone (ROCEPHIN) 1 g in sodium chloride 0.9 % 100 mL IVPB     1 g 200 mL/hr over 30 Minutes Intravenous  Once 11/19/2017 1523  12/03/2017 1635       Objective   Vitals:   11/13/17 0112 11/13/17 0207 11/13/17 0525 11/13/17 1448  BP: 136/65  (!) 143/68 (!) 170/71  Pulse: 92  (!) 101 97  Resp: 20  18 (!) 28  Temp: (!) 100.5 F (38.1 C) 100.1 F (37.8 C) 99.4 F (37.4 C) (!) 100.4 F (38 C)  TempSrc: Axillary Axillary Axillary Oral  SpO2: 99%  100% 100%  Weight: 40.5 kg     Height:        Intake/Output Summary (Last 24 hours) at 11/13/2017 1456 Last data filed at 11/13/2017 1300 Gross per 24 hour  Intake 480 ml  Output 1006 ml  Net -526 ml   Filed Weights   11/28/2017 2033 11/13/17 0035 11/13/17 0112  Weight: 41.5 kg 40.5 kg 40.5 kg     Physical Examination:    General: Appears in no acute distress  Cardiovascular: S1-S2, regular  Respiratory: Clear to auscultation bilaterally  Abdomen: Soft, nontender, no organomegaly  Extremities: No edema to lower extremities  Neurologic: Alert, awake, not oriented x3     Data Reviewed: I have personally reviewed following labs and imaging studies  CBG: Recent Labs  Lab 11/22/2017 1708 11/13/17 0108 11/13/17 0732 11/13/17 1337  GLUCAP 208* 135* 256* 293*    CBC: Recent Labs  Lab 11/09/2017 1237  WBC 6.6  NEUTROABS 4.9  HGB 12.3  HCT 37.7  MCV 93.1  PLT 107*    Basic Metabolic Panel: Recent Labs  Lab 11/26/2017 1330 11/13/17 0909  NA 134* 137  K 4.8 3.6  CL 91* 93*  CO2 28 25  GLUCOSE 248* 274*  BUN 40* 23*  CREATININE 7.45* 4.06*  CALCIUM 9.6 9.5  PHOS  --  5.0*    Recent Results (from the past 240 hour(s))  Culture, blood (Routine x 2)     Status: None (Preliminary result)   Collection Time: 11/04/2017  1:00 PM  Result Value Ref Range Status   Specimen Description BLOOD LEFT ANTECUBITAL  Final   Special Requests   Final    BOTTLES DRAWN AEROBIC AND ANAEROBIC Blood Culture results may not be optimal due to an inadequate volume of blood received in culture bottles   Culture   Final    NO GROWTH < 24 HOURS Performed at  [facility], [address], [place], [place] [address]    Report Status PENDING  Incomplete  Culture, blood (Routine x 2)     Status: None (Preliminary result)   Collection Time: 11/09/2017  1:14 PM  Result Value Ref Range Status   Specimen Description BLOOD LEFT HAND  Final   Special Requests   Final    BOTTLES DRAWN AEROBIC ONLY Blood Culture results may not be optimal due to an inadequate volume of blood received in culture bottles   Culture   Final    NO GROWTH < 24 HOURS Performed at [facility], [address], [place], [place] [address]    Report Status PENDING  Incomplete  MRSA PCR Screening     Status: None   Collection Time: 11/13/17  5:08 AM  Result Value Ref Range Status   MRSA by PCR NEGATIVE NEGATIVE Final    Comment:        The GeneXpert MRSA Assay (FDA approved for NASAL specimens only), is one component of a comprehensive MRSA colonization surveillance program. It is not intended to diagnose MRSA infection nor to guide or monitor treatment for MRSA infections. Performed at [facility], [address], [place], [place] [address]      Liver Function Tests: Recent Labs  Lab 11/11/2017 1330 11/13/17 0909  AST 39 32  ALT 42 34  ALKPHOS 132* 105  BILITOT 0.8 1.4*  PROT 7.2 7.1  ALBUMIN 3.8 3.4*   No results for input(s): LIPASE, AMYLASE in the last 168 hours. No results for input(s): AMMONIA in the last 168 hours.  Cardiac Enzymes: No results for input(s): CKTOTAL, CKMB, CKMBINDEX, TROPONINI in the last 168 hours. BNP (last 3 results) No results for input(s): BNP in the last 8760 hours.  ProBNP (last 3 results) No results for input(s): PROBNP in the last 8760 hours.    Studies: Dg Chest 2 View  Result Date: 11/26/2017 CLINICAL DATA:  57 year old female with a history altered mental status EXAM: CHEST - 2 VIEW COMPARISON:  05/14/2017 FINDINGS: Cardiomediastinal silhouette unchanged. No central vascular congestion. No interlobular  septal thickening. Improved aeration compared to the prior with resolution of airspace and interstitial opacities. No confluent airspace disease. No pneumothorax or pleural effusion. No displaced fracture. Vascular stents of the right upper extremity. IMPRESSION: Negative acute cardiopulmonary disease. Electronically Signed   By: Gilmer Mor D.O.   On: 12/03/2017 12:27    Scheduled Meds: . amLODipine  5 mg Oral Daily  . Chlorhexidine Gluconate Cloth  6 each Topical Q0600  . [START ON 25-Nov-2017] doxercalciferol  2 mcg Intravenous Q M,W,F-HD  . ferric citrate  840 mg  Oral TID WC  . heparin  5,000 Units Subcutaneous Q8H  . insulin aspart  0-9 Units Subcutaneous TID WC  . levETIRAcetam  1,000 mg Oral Daily  . levETIRAcetam  500 mg Oral Q M,W,F-1800  . multivitamin  1 tablet Oral QHS      Time spent: 25 min  Meredeth Ide   [facility] Pager [phone]. If 7PM-7AM, please contact night-coverage at [email or web address], Office  [phone]  password [email or web address]  11/13/2017, 2:56 PM  LOS: 1 day

## 2017-11-13 NOTE — Progress Notes (Signed)
HD tx completed @ 0015 w/o problem other than pt's confusion (trying to get OOB, not leaving access arm still causing alarms, not f/c, hard to redirect), UF goal met, blood rinsed back, VSS w/ increased temp, pt refused tylenol when pre assess temp was found to be 103.3, report called to Cloyde Reams, RN

## 2017-11-13 NOTE — Progress Notes (Addendum)
[facility] Progress Note   Subjective:   No change.  Sister at bedside and believes patient is less aware than yesterday, reporting she did not know who she was.  States at baseline she is happy, responsive and completes her own ADLs.  Objective Vitals:   11/13/17 0035 11/13/17 0112 11/13/17 0207 11/13/17 0525  BP: (!) 152/64 136/65  (!) 143/68  Pulse: 89 92  (!) 101  Resp: (!) 24 20  18   Temp: (S) (!) 101 F (38.3 C) (!) 100.5 F (38.1 C) 100.1 F (37.8 C) 99.4 F (37.4 C)  TempSrc: Oral Axillary Axillary Axillary  SpO2: 100% 99%  100%  Weight: 40.5 kg 40.5 kg    Height:       Physical Exam General:NAD, frail, thin chronically ill appearing female Heart:RRR, +3/6 systolic blowing murmur Lungs:CTAB anteriorly Abdomen:soft, NTND Extremities:no LE edema, ischemic changes Dialysis Access: RU AVG +b/t   Filed Weights   11/25/2017 2033 11/13/17 0035 11/13/17 0112  Weight: 41.5 kg 40.5 kg 40.5 kg    Intake/Output Summary (Last 24 hours) at 11/13/2017 1319 Last data filed at 11/13/2017 0900 Gross per 24 hour  Intake 360 ml  Output 1006 ml  Net -646 ml    Additional Objective Labs: Basic Metabolic Panel: Recent Labs  Lab 11/20/2017 1330 11/13/17 0909  NA 134* 137  K 4.8 3.6  CL 91* 93*  CO2 28 25  GLUCOSE 248* 274*  BUN 40* 23*  CREATININE 7.45* 4.06*  CALCIUM 9.6 9.5   Liver Function Tests: Recent Labs  Lab 11/09/2017 1330 11/13/17 0909  AST 39 32  ALT 42 34  ALKPHOS 132* 105  BILITOT 0.8 1.4*  PROT 7.2 7.1  ALBUMIN 3.8 3.4*   CBC: Recent Labs  Lab 11/28/2017 1237  WBC 6.6  NEUTROABS 4.9  HGB 12.3  HCT 37.7  MCV 93.1  PLT 107*   Blood Culture    Component Value Date/Time   SDES BLOOD LEFT HAND 11/17/2017 1314   SPECREQUEST  11/10/2017 1314    BOTTLES DRAWN AEROBIC ONLY Blood Culture results may not be optimal due to an inadequate volume of blood received in culture bottles   CULT  12/02/2017 1314    NO GROWTH < 24  HOURS Performed at [facility], [address] [place], [place] [address]    REPTSTATUS PENDING 12/02/2017 1314   CBG: Recent Labs  Lab 11/13/2017 1708 11/13/17 0108 11/13/17 0732  GLUCAP 208* 135* 256*   Lab Results  Component Value Date   INR 1.03 11/11/2017   Studies/Results: Dg Chest 2 View  Result Date: 11/11/2017 CLINICAL DATA:  57 year old female with a history altered mental status EXAM: CHEST - 2 VIEW COMPARISON:  05/14/2017 FINDINGS: Cardiomediastinal silhouette unchanged. No central vascular congestion. No interlobular septal thickening. Improved aeration compared to the prior with resolution of airspace and interstitial opacities. No confluent airspace disease. No pneumothorax or pleural effusion. No displaced fracture. Vascular stents of the right upper extremity. IMPRESSION: Negative acute cardiopulmonary disease. Electronically Signed   By: Gilmer MorJaime  Wagner D.O.   On: 11/11/2017 12:27    Medications: . sodium chloride    . sodium chloride    . cefTRIAXone (ROCEPHIN)  IV 1 g (11/13/17 1013)   . amLODipine  5 mg Oral Daily  . Chlorhexidine Gluconate Cloth  6 each Topical Q0600  . [START ON 11/07/2017] doxercalciferol  2 mcg Intravenous Q M,W,F-HD  . ferric citrate  840 mg Oral TID WC  . heparin  5,000 Units Subcutaneous Q8H  . insulin aspart  0-9 Units Subcutaneous TID WC  . levETIRAcetam  1,000 mg Oral Daily  . levETIRAcetam  500 mg Oral Q M,W,F-1800  . multivitamin  1 tablet Oral QHS    Dialysis Orders: MWF - [facility]  3.5hrs, BFR 450, DFR AF1.5,  EDW 40.5kg, 2K/ 2Ca  Access: RU AVG  Heparin 1300 Unit bolus Hectorol IV qHD   Assessment/Plan: 1.  AMS - likely due to high fevers/ infection. Hx status epilepticus in Jan 2019, prob some residual underlying cognitive impairment.  No improvement. Known Hx traumatic brain injury - not at baseline per family.  Likely d/t UTI - UC pending, febrile, lactic acid 3.29. WBC normal. Per primary 2. Fever - Tmax 103.3  overnight.  BC NGTD. UC pending.  3. ESRD -  HD MWF. HD yesterday tolerated well except confusion.  Net UF removed 1L. K 3.6 today. 4.  Hypertension/volume  - BP improved post HD. Does not appear volume overloaded on exam.  Titrate down as tolerated.  5.  Anemia of CKD - Hgb 12.3. No indication for ESA at this time 6.  Metabolic bone disease - Ca 9.5. Ordered phos. Continue VDRA, binders.  7.  Nutrition - Alb 3.4. Renal diet w/fluid restrictions. Renavite. Nepro. 8. Seizure disorder - on keppra. per primary 9. DMT2 - BS ^. On insulin. Per primary. 10. Hep C - per primary 11. Hx CVA  Virgina Norfolk, PA-C [facility] Pager: [phone] 11/13/2017,1:19 PM  LOS: 1 day   Pt seen, examined and agree w A/P as above.  Vinson Moselle MD [employer] pager [phone]   11/13/2017, 2:13 PM

## 2017-11-14 ENCOUNTER — Encounter (HOSPITAL_COMMUNITY): Admission: EM | Disposition: E | Payer: Self-pay | Source: Home / Self Care | Attending: Critical Care Medicine

## 2017-11-14 ENCOUNTER — Inpatient Hospital Stay (HOSPITAL_COMMUNITY): Payer: Medicaid Other | Admitting: Anesthesiology

## 2017-11-14 ENCOUNTER — Inpatient Hospital Stay (HOSPITAL_COMMUNITY): Payer: Medicaid Other

## 2017-11-14 DIAGNOSIS — D696 Thrombocytopenia, unspecified: Secondary | ICD-10-CM

## 2017-11-14 DIAGNOSIS — R509 Fever, unspecified: Secondary | ICD-10-CM

## 2017-11-14 DIAGNOSIS — I629 Nontraumatic intracranial hemorrhage, unspecified: Secondary | ICD-10-CM

## 2017-11-14 DIAGNOSIS — Z7189 Other specified counseling: Secondary | ICD-10-CM

## 2017-11-14 DIAGNOSIS — Z515 Encounter for palliative care: Secondary | ICD-10-CM

## 2017-11-14 DIAGNOSIS — N186 End stage renal disease: Secondary | ICD-10-CM

## 2017-11-14 DIAGNOSIS — Z992 Dependence on renal dialysis: Secondary | ICD-10-CM

## 2017-11-14 DIAGNOSIS — I609 Nontraumatic subarachnoid hemorrhage, unspecified: Secondary | ICD-10-CM

## 2017-11-14 DIAGNOSIS — J9601 Acute respiratory failure with hypoxia: Secondary | ICD-10-CM

## 2017-11-14 LAB — COMPREHENSIVE METABOLIC PANEL
ALK PHOS: 103 U/L (ref 38–126)
ALT: 31 U/L (ref 0–44)
AST: 33 U/L (ref 15–41)
Albumin: 3.3 g/dL — ABNORMAL LOW (ref 3.5–5.0)
Anion gap: 25 — ABNORMAL HIGH (ref 5–15)
BILIRUBIN TOTAL: 1.3 mg/dL — AB (ref 0.3–1.2)
BUN: 48 mg/dL — ABNORMAL HIGH (ref 6–20)
CALCIUM: 9.5 mg/dL (ref 8.9–10.3)
CO2: 21 mmol/L — ABNORMAL LOW (ref 22–32)
CREATININE: 6.53 mg/dL — AB (ref 0.44–1.00)
Chloride: 88 mmol/L — ABNORMAL LOW (ref 98–111)
GFR calc Af Amer: 7 mL/min — ABNORMAL LOW (ref >60)
GFR calc non Af Amer: 6 mL/min — ABNORMAL LOW (ref >60)
Glucose, Bld: 481 mg/dL — ABNORMAL HIGH (ref 70–99)
Potassium: 4 mmol/L (ref 3.5–5.1)
Sodium: 134 mmol/L — ABNORMAL LOW (ref 135–145)
TOTAL PROTEIN: 7.3 g/dL (ref 6.5–8.1)

## 2017-11-14 LAB — ABO/RH: ABO/RH(D): A POS

## 2017-11-14 LAB — GLUCOSE, CAPILLARY
GLUCOSE-CAPILLARY: 487 mg/dL — AB (ref 70–99)
GLUCOSE-CAPILLARY: 498 mg/dL — AB (ref 70–99)
GLUCOSE-CAPILLARY: 363 mg/dL — AB (ref 70–99)
GLUCOSE-CAPILLARY: 331 mg/dL — AB (ref 70–99)
GLUCOSE-CAPILLARY: 256 mg/dL — AB (ref 70–99)
GLUCOSE-CAPILLARY: 221 mg/dL — AB (ref 70–99)
GLUCOSE-CAPILLARY: 152 mg/dL — AB (ref 70–99)
Glucose-Capillary: 485 mg/dL — ABNORMAL HIGH (ref 70–99)
Glucose-Capillary: 456 mg/dL — ABNORMAL HIGH (ref 70–99)
Glucose-Capillary: 365 mg/dL — ABNORMAL HIGH (ref 70–99)
Glucose-Capillary: 110 mg/dL — ABNORMAL HIGH (ref 70–99)
Glucose-Capillary: 147 mg/dL — ABNORMAL HIGH (ref 70–99)

## 2017-11-14 LAB — CBC WITH DIFFERENTIAL/PLATELET
Abs Immature Granulocytes: 0.1 10*3/uL (ref 0.0–0.1)
BASOS ABS: 0.1 10*3/uL (ref 0.0–0.1)
Basophils Relative: 0 %
Eosinophils Absolute: 0.1 10*3/uL (ref 0.0–0.7)
Eosinophils Relative: 1 %
HEMATOCRIT: 38.4 % (ref 36.0–46.0)
HEMOGLOBIN: 12.4 g/dL (ref 12.0–15.0)
IMMATURE GRANULOCYTES: 1 %
LYMPHS ABS: 1.1 10*3/uL (ref 0.7–4.0)
LYMPHS PCT: 7 %
MCH: 29.5 pg (ref 26.0–34.0)
MCHC: 32.3 g/dL (ref 30.0–36.0)
MCV: 91.4 fL (ref 78.0–100.0)
Monocytes Absolute: 1.6 10*3/uL — ABNORMAL HIGH (ref 0.1–1.0)
Monocytes Relative: 9 %
NEUTROS PCT: 82 %
Neutro Abs: 14 10*3/uL — ABNORMAL HIGH (ref 1.7–7.7)
Platelets: 147 10*3/uL — ABNORMAL LOW (ref 150–400)
RBC: 4.2 MIL/uL (ref 3.87–5.11)
RDW: 13.1 % (ref 11.5–15.5)
WBC: 17 10*3/uL — AB (ref 4.0–10.5)

## 2017-11-14 LAB — SODIUM
SODIUM: 145 mmol/L (ref 135–145)
SODIUM: 148 mmol/L — AB (ref 135–145)
Sodium: 132 mmol/L — ABNORMAL LOW (ref 135–145)
Sodium: 140 mmol/L (ref 135–145)

## 2017-11-14 LAB — BLOOD GAS, ARTERIAL
Acid-base deficit: 2.7 mmol/L — ABNORMAL HIGH (ref 0.0–2.0)
BICARBONATE: 21 mmol/L (ref 20.0–28.0)
Drawn by: 347191
FIO2: 0.4
MECHVT: 400 mL
O2 SAT: 99.1 %
PATIENT TEMPERATURE: 98.6
PCO2 ART: 32.2 mmHg (ref 32.0–48.0)
PEEP/CPAP: 5 cmH2O
PH ART: 7.429 (ref 7.350–7.450)
PO2 ART: 156 mmHg — AB (ref 83.0–108.0)
RATE: 15 resp/min

## 2017-11-14 LAB — LACTIC ACID, PLASMA: Lactic Acid, Venous: 1.4 mmol/L (ref 0.5–1.9)

## 2017-11-14 LAB — PROTIME-INR
INR: 1.05
PROTHROMBIN TIME: 13.6 s (ref 11.4–15.2)

## 2017-11-14 LAB — TYPE AND SCREEN
ABO/RH(D): A POS
Antibody Screen: NEGATIVE

## 2017-11-14 LAB — PHOSPHORUS: Phosphorus: 3.9 mg/dL (ref 2.5–4.6)

## 2017-11-14 LAB — TROPONIN I
Troponin I: 0.14 ng/mL (ref <0.03)
Troponin I: 0.11 ng/mL (ref <0.03)
Troponin I: 0.09 ng/mL (ref <0.03)

## 2017-11-14 LAB — MAGNESIUM: MAGNESIUM: 2.5 mg/dL — AB (ref 1.7–2.4)

## 2017-11-14 LAB — PROCALCITONIN: PROCALCITONIN: 2.54 ng/mL

## 2017-11-14 LAB — APTT: APTT: 76 s — AB (ref 24–36)

## 2017-11-14 SURGERY — CRANIOTOMY HEMATOMA EVACUATION SUBDURAL
Anesthesia: Choice | Laterality: Right

## 2017-11-14 MED ORDER — ETOMIDATE 2 MG/ML IV SOLN
15.0000 mg | Freq: Once | INTRAVENOUS | Status: AC
  Administered 2017-11-14: 15 mg via INTRAVENOUS

## 2017-11-14 MED ORDER — FENTANYL 2500MCG IN NS 250ML (10MCG/ML) PREMIX INFUSION
0.0000 ug/h | INTRAVENOUS | Status: DC
  Administered 2017-11-14: 25 ug/h via INTRAVENOUS
  Filled 2017-11-14: qty 250

## 2017-11-14 MED ORDER — BUPIVACAINE HCL (PF) 0.5 % IJ SOLN
INTRAMUSCULAR | Status: AC
  Filled 2017-11-14: qty 30

## 2017-11-14 MED ORDER — HYDRALAZINE HCL 20 MG/ML IJ SOLN: 10.0000 mg | INTRAMUSCULAR | Status: DC | PRN

## 2017-11-14 MED ORDER — THROMBIN (RECOMBINANT) 5000 UNITS EX SOLR
CUTANEOUS | Status: AC
  Filled 2017-11-14: qty 5000

## 2017-11-14 MED ORDER — THROMBIN (RECOMBINANT) 20000 UNITS EX SOLR
CUTANEOUS | Status: AC
  Filled 2017-11-14: qty 20000

## 2017-11-14 MED ORDER — PIPERACILLIN-TAZOBACTAM 3.375 G IVPB
3.3750 g | Freq: Two times a day (BID) | INTRAVENOUS | Status: DC
  Filled 2017-11-14 (×2): qty 50

## 2017-11-14 MED ORDER — SODIUM CHLORIDE 0.9 % IV SOLN: INTRAVENOUS | Status: DC

## 2017-11-14 MED ORDER — FAMOTIDINE 40 MG/5ML PO SUSR
20.0000 mg | Freq: Two times a day (BID) | ORAL | Status: DC
  Administered 2017-11-14: 20 mg
  Filled 2017-11-14: qty 2.5

## 2017-11-14 MED ORDER — MIDAZOLAM HCL 2 MG/2ML IJ SOLN: 2.0000 mg | INTRAMUSCULAR | Status: DC | PRN

## 2017-11-14 MED ORDER — FENTANYL CITRATE (PF) 250 MCG/5ML IJ SOLN
INTRAMUSCULAR | Status: AC
  Filled 2017-11-14: qty 5

## 2017-11-14 MED ORDER — SODIUM CHLORIDE 0.9 % IV BOLUS
1000.0000 mL | Freq: Once | INTRAVENOUS | Status: AC
  Administered 2017-11-14: 1000 mL via INTRAVENOUS

## 2017-11-14 MED ORDER — ACETAMINOPHEN 160 MG/5ML PO SOLN
650.0000 mg | Freq: Four times a day (QID) | ORAL | Status: DC | PRN
Start: 2017-11-14 — End: 2017-11-15

## 2017-11-14 MED ORDER — INSULIN ASPART 100 UNIT/ML ~~LOC~~ SOLN: 2.0000 [IU] | SUBCUTANEOUS | Status: DC

## 2017-11-14 MED ORDER — PROPOFOL 10 MG/ML IV BOLUS
INTRAVENOUS | Status: AC
  Filled 2017-11-14: qty 20

## 2017-11-14 MED ORDER — ORAL CARE MOUTH RINSE
15.0000 mL | OROMUCOSAL | Status: DC
  Administered 2017-11-14 – 2017-11-15 (×14): 15 mL via OROMUCOSAL

## 2017-11-14 MED ORDER — SODIUM CHLORIDE 3 % IV SOLN
INTRAVENOUS | Status: AC
  Administered 2017-11-14 (×2): 50 mL/h via INTRAVENOUS
  Filled 2017-11-14 (×3): qty 500

## 2017-11-14 MED ORDER — MIDAZOLAM HCL 2 MG/2ML IJ SOLN
2.0000 mg | Freq: Once | INTRAMUSCULAR | Status: AC
  Administered 2017-11-14: 2 mg via INTRAVENOUS

## 2017-11-14 MED ORDER — INSULIN ASPART 100 UNIT/ML ~~LOC~~ SOLN
0.0000 [IU] | SUBCUTANEOUS | Status: DC
  Administered 2017-11-14: 1 [IU] via SUBCUTANEOUS
  Administered 2017-11-15 (×3): 2 [IU] via SUBCUTANEOUS
  Administered 2017-11-15: 3 [IU] via SUBCUTANEOUS
  Administered 2017-11-15: 5 [IU] via SUBCUTANEOUS

## 2017-11-14 MED ORDER — ONDANSETRON HCL 4 MG/2ML IJ SOLN: 4.0000 mg | Freq: Four times a day (QID) | INTRAMUSCULAR | Status: DC | PRN

## 2017-11-14 MED ORDER — CHLORHEXIDINE GLUCONATE 0.12% ORAL RINSE (MEDLINE KIT)
15.0000 mL | Freq: Two times a day (BID) | OROMUCOSAL | Status: DC
  Administered 2017-11-14 – 2017-11-15 (×3): 15 mL via OROMUCOSAL

## 2017-11-14 MED ORDER — FENTANYL CITRATE (PF) 100 MCG/2ML IJ SOLN
50.0000 ug | Freq: Once | INTRAMUSCULAR | Status: AC
  Administered 2017-11-14: 50 ug via INTRAVENOUS

## 2017-11-14 MED ORDER — FENTANYL CITRATE (PF) 100 MCG/2ML IJ SOLN
100.0000 ug | INTRAMUSCULAR | Status: DC | PRN
  Administered 2017-11-15: 100 ug via INTRAVENOUS
  Filled 2017-11-14: qty 2

## 2017-11-14 MED ORDER — MIDAZOLAM HCL 2 MG/2ML IJ SOLN
INTRAMUSCULAR | Status: AC
  Filled 2017-11-14: qty 2

## 2017-11-14 MED ORDER — DOCUSATE SODIUM 50 MG/5ML PO LIQD: 100.0000 mg | Freq: Two times a day (BID) | ORAL | Status: DC | PRN

## 2017-11-14 MED ORDER — SUCCINYLCHOLINE CHLORIDE 20 MG/ML IJ SOLN
100.0000 mg | Freq: Once | INTRAMUSCULAR | Status: AC
  Filled 2017-11-14: qty 5

## 2017-11-14 MED ORDER — FAMOTIDINE 40 MG/5ML PO SUSR
20.0000 mg | Freq: Every day | ORAL | Status: DC
  Administered 2017-11-15: 20 mg
  Filled 2017-11-14: qty 2.5

## 2017-11-14 MED ORDER — ACETAMINOPHEN 650 MG RE SUPP: 650.0000 mg | Freq: Four times a day (QID) | RECTAL | Status: DC | PRN

## 2017-11-14 MED ORDER — FENTANYL CITRATE (PF) 100 MCG/2ML IJ SOLN
INTRAMUSCULAR | Status: AC
  Filled 2017-11-14: qty 2

## 2017-11-14 MED ORDER — SODIUM CHLORIDE 0.9 % IV SOLN: 250.0000 mL | INTRAVENOUS | Status: DC | PRN

## 2017-11-14 MED ORDER — SODIUM CHLORIDE 0.9 % IV SOLN
INTRAVENOUS | Status: DC
  Administered 2017-11-14: 4.4 [IU]/h via INTRAVENOUS
  Filled 2017-11-14: qty 1

## 2017-11-14 MED ORDER — FENTANYL CITRATE (PF) 100 MCG/2ML IJ SOLN
100.0000 ug | INTRAMUSCULAR | Status: DC | PRN
  Administered 2017-11-14: 100 ug via INTRAVENOUS
  Filled 2017-11-14: qty 2

## 2017-11-14 MED ORDER — LEVETIRACETAM 100 MG/ML PO SOLN
1000.0000 mg | Freq: Every day | ORAL | Status: DC
  Administered 2017-11-14 – 2017-11-15 (×2): 1000 mg
  Filled 2017-11-14 (×2): qty 10

## 2017-11-14 NOTE — Consult Note (Addendum)
Consultation Note Date: 11/04/2017   Patient Name: Alexis Sims  DOB: [birth year]  MRN: [id number]  Age / Sex: 57 y.o., female  PCP: Nolene Ebbs, MD Referring Physician: Reyne Dumas, MD  Reason for Consultation: Establishing goals of care  HPI/Patient Profile: 57 y.o. female  with past medical history of ESRD on HD, CVA, seizures, DM, hepatis C, and traumatic brain injury admitted on 11/28/2017 with altered mental status. On admission, positive for UTI and IV rocephin initiated. On 8/11 early AM,rapid response team called for worsening mental status. MRI demonstrated large multifocal intraparenchymal hemorrhage with significant R>L shift, IVH, SAH, and ischemia of frontal lobes. Patient unresponsive with non-reactive pupils, negative doll's eyes, and no motor response. Neurosurgery spoke with sister/caregiver about diagnoses/poor prognosis. Caregiver declined craniectomy and hematoma evacuation. Palliative medicine consultation for goals of care/terminal care.    Clinical Assessment and Goals of Care:  I have reviewed medical records, discussed with care team, and met with sisters [name] and [name]) and brother [name]) at bedside to discuss diagnosis, prognosis, GOC, and EOL wishes.  Introduced Palliative Medicine as specialized medical care for people living with serious illness. It focuses on providing relief from the symptoms and stress of a serious illness.   We discussed a brief life review of the patient. Family describes her as "sassy" and "independent." Ishia has been on dialysis for about 10 years. She suffered from a stroke about 4 years ago and has since lived with her sister, [name] who the siblings describe as "the backbone" of the family. Prior to hospitalization, patient fairly independent and tolerating dialysis on M, W, F's. Fair appetite.   Alonni has two adult sons who are not local or  involved in her care--one son incarcerated in [place] and another son living in [place].   Discussed events leading up to hospitalization and course of hospitalization including diagnoses and interventions. [name] spoke with neurosurgeon early this morning and has a good understanding of poor prognosis secondary to results from MRI. She confirmed that she declined surgical interventions, feeling this would only be more harm to her sister. [name], as well as [place], feel the patient would NOT want to live with poor quality of life and in a vegetative state. [name] confirms her decision against resuscitation with underlying co-morbidities, frailty, and poor prognosis.   [name] has contacted son in [place], who is unable to come to [facility]. She has also notified the prison for the patient's incarcerated son. All family members are on the same page and agree that [name] would not wish to live with poor quality of life. [name] is trying to get in contact with two other siblings in [place].   The difference between aggressive medical intervention and comfort care was considered in light of the patient's and families goals of care. Explained process of withdrawing care to comfort measures only, ensuring comfort, peace and dignity at EOL. Educated on utilizing medications as needed to ensure comfort. Family understands poor prognosis when ventilator is removed.   [name] wishes to  give her other two siblings until tomorrow morning. If she is unable to get in touch with them by tomorrow, she is ready to withdraw care. [place] agree with this plan and do not wish to see [name] "suffer." Family shares that she has lived her life to the fullest.  [name] shares that last Wednesday, prior to admission, [name] made a comment to home health nurse that she was "tired" and "tired of dialysis." [name] believes she may have sensed this event happening.   Answered all questions and concerns regarding plan of care.  Spiritual and emotional support provided. Therapeutic listening as family shares stories of [name].    SUMMARY OF RECOMMENDATIONS    Continue current level of care.   Limited code. In the event of cardiac arrest, NO resuscitation or defibrillation. Allow comfort and dignity at EOL.   [name] is the patient's sister and primary Actuary. The patient has two sons, one incarcerated and one unable to come from [place]. [name] confirms that all necessary family members (including sons) are aware of the patient's condition and agree that she would NOT wish to live with poor quality of life.   [name] has reached out to two other siblings. She is waiting until tomorrow AM. If [name] does not hear from siblings by tomorrow AM, she will likely be ready for shift to comfort and compassionate extubation, understanding poor prognosis.   PMT will follow.  Code Status/Advance Care Planning:  Limited code  Symptom Management:   Per attending  Palliative Prophylaxis:   Aspiration, Delirium Protocol, Frequent Pain Assessment, Oral Care and Turn Reposition  Psycho-social/Spiritual:   Desire for further Chaplaincy support:yes  Additional Recommendations: Caregiving  Support/Resources and Compassionate Wean Education  Prognosis:   Unable to determine: poor prognosis with acute large frontal hemorrhage, IVH, SAH, and midline shift. Patient unresponsive to all stimuli.   Discharge Planning: Anticipated Hospital Death      Primary Diagnoses: Present on Admission: . HTN (hypertension) . Type 2 diabetes mellitus with hypoglycemia without coma ([place]) . Thrombocytopenia ([place]) . Fever . UTI (urinary tract infection) . Acute metabolic encephalopathy   I have reviewed the medical record, interviewed the patient and family, and examined the patient. The following aspects are pertinent.  Past Medical History:  Diagnosis Date  . Blood clots in brain   . CKD (chronic kidney disease)  stage V requiring chronic dialysis ([place])   . Diabetes mellitus type 2, insulin dependent ([place])   . Gait disturbance, post-stroke   . History of CVA (cerebrovascular accident)   . HTN (hypertension)   . Seizures ([place])    Social History   Socioeconomic History  . Marital status: Single    Spouse name: Not on file  . Number of children: Not on file  . Years of education: Not on file  . Highest education level: Not on file  Occupational History  . Not on file  Social Needs  . Financial resource strain: Not on file  . Food insecurity:    Worry: Not on file    Inability: Not on file  . Transportation needs:    Medical: Not on file    Non-medical: Not on file  Tobacco Use  . Smoking status: Current Every Day Smoker    Packs/day: 0.50    Types: Cigarettes, Pipe  . Smokeless tobacco: Never Used  Substance and Sexual Activity  . Alcohol use: No  . Drug use: No  . Sexual activity: Not on file  Lifestyle  .  Physical activity:    Days per week: Not on file    Minutes per session: Not on file  . Stress: Not on file  Relationships  . Social connections:    Talks on phone: Not on file    Gets together: Not on file    Attends religious service: Not on file    Active member of club or organization: Not on file    Attends meetings of clubs or organizations: Not on file    Relationship status: Not on file  Other Topics Concern  . Not on file  Social History Narrative  . Not on file   No family history on file. Scheduled Meds: . amLODipine  5 mg Oral Daily  . chlorhexidine gluconate (MEDLINE KIT)  15 mL Mouth Rinse BID  . Chlorhexidine Gluconate Cloth  6 each Topical Q0600  . [START ON November 28, 2017] doxercalciferol  2 mcg Intravenous Q M,W,F-HD  . [START ON 28-Nov-2017] famotidine  20 mg Per Tube Daily  . ferric citrate  840 mg Oral TID WC  . levETIRAcetam  1,000 mg Per Tube Daily  . mouth rinse  15 mL Mouth Rinse 10 times per day   Continuous Infusions: . sodium chloride    .  sodium chloride    . sodium chloride    . fentaNYL infusion INTRAVENOUS Stopped (11/19/2017 1352)  . insulin (NOVOLIN-R) infusion 9.8 Units/hr (11/10/2017 1204)  . piperacillin-tazobactam (ZOSYN)  IV    . sodium chloride (hypertonic) 50 mL/hr at 11/11/2017 0700   PRN Meds:.sodium chloride, sodium chloride, sodium chloride, acetaminophen (TYLENOL) oral liquid 160 mg/5 mL **OR** acetaminophen, docusate, fentaNYL (SUBLIMAZE) injection, fentaNYL (SUBLIMAZE) injection, heparin, heparin, hydrALAZINE, lidocaine (PF), lidocaine-prilocaine, midazolam, midazolam, ondansetron (ZOFRAN) IV, pentafluoroprop-tetrafluoroeth Medications Prior to Admission:  Prior to Admission medications   Medication Sig Start Date End Date Taking? Authorizing Provider  amLODipine (NORVASC) 5 MG tablet Take 5 mg by mouth daily.  04/22/17  Yes [provider]  AURYXIA 1 GM 210 MG(Fe) tablet Take 840 mg by mouth 3 (three) times daily with meals. 05/10/17  Yes [provider]  LANTUS SOLOSTAR 100 UNIT/ML Solostar Pen Inject 10 Units into the skin at bedtime. Patient taking differently: Inject 16 Units into the skin at bedtime.  05/18/17  Yes Debbe Odea, MD  levETIRAcetam (KEPPRA) 1000 MG tablet Take 1 tablet (1,000 mg total) by mouth daily. 05/19/17  Yes Debbe Odea, MD  levETIRAcetam (KEPPRA) 500 MG tablet Take 1 tablet (500 mg total) by mouth every Monday, Wednesday, and Friday at 6 PM. 05/19/17  Yes Debbe Odea, MD   No Known Allergies Review of Systems  Unable to perform ROS: Acuity of condition   Physical Exam  Constitutional: She is intubated.  Eyes: Right pupil is not reactive. Left pupil is not reactive. Pupils are unequal.  Cardiovascular: Regular rhythm.  Pulmonary/Chest: No accessory muscle usage. No tachypnea. She is intubated. No respiratory distress.  Neurological: She is unresponsive. GCS eye subscore is 1. GCS verbal subscore is 1. GCS motor subscore is 1.  Unresponsive to all stimuli  Skin:  Skin is warm and dry.  Nursing note and vitals reviewed.  Vital Signs: BP (!) 142/69   Pulse 75   Temp 98.5 F (36.9 C) (Oral)   Resp 20   Ht 5' 2"  (1.575 m)   Wt 37.8 kg   SpO2 100%   BMI 15.24 kg/m  Pain Scale: CPOT   Pain Score: Asleep   SpO2: SpO2: 100 % O2 Device:SpO2: 100 %  O2 Flow Rate: .   IO: Intake/output summary:   Intake/Output Summary (Last 24 hours) at 11/20/2017 1530 Last data filed at 11/06/2017 1030 Gross per 24 hour  Intake 109.19 ml  Output -  Net 109.19 ml    LBM: Last BM Date: 11/29/2017 Baseline Weight: Weight: 37 kg Most recent weight: Weight: 37.8 kg     Palliative Assessment/Data: PPS 10%   Flowsheet Rows     Most Recent Value  Intake Tab  Referral Department  Critical care  Unit at Time of Referral  ICU  Palliative Care Primary Diagnosis  Neurology  Palliative Care Type  New Palliative care  Reason for referral  Clarify Goals of Care  Date first seen by Palliative Care  11/17/2017  Clinical Assessment  Palliative Performance Scale Score  10%  Psychosocial & Spiritual Assessment  Palliative Care Outcomes  Patient/Family meeting held?  Yes  Who was at the meeting?  sisters, brother  Palliative Care Outcomes  Clarified goals of care, Provided end of life care assistance, ACP counseling assistance, Provided psychosocial or spiritual support      Time In: 1300 Time Out: 1415 Time Total: 33mn Greater than 50%  of this time was spent counseling and coordinating care related to the above assessment and plan.  Signed by:  MIhor Dow FNP-C Palliative Medicine Team  Phone: [phone]: [phone]  Please contact Palliative Medicine Team phone at [phone] questions and concerns.  For individual provider: See AShea Evans

## 2017-11-14 NOTE — Progress Notes (Signed)
Pt to MRI

## 2017-11-14 NOTE — Consult Note (Signed)
PULMONARY / CRITICAL CARE MEDICINE   Name: Alexis Sims MRN: [id number] DOB: 1960-12-11    ADMISSION DATE:  11/28/2017 CONSULTATION DATE: 11/05/2017  REFERRING MD: Neurosurgery PA  CHIEF COMPLAINT: Intraparenchymal hemorrhage, Respiratory failure  HISTORY OF PRESENT ILLNESS:   57yoF with hx HTN, DM, SZ, prior CVA, ESRD on HD, and HCV who was admitted 8/9 with AMS that began 8/8 PM. At the time of admission, patient was hypoglycemic with BG 29 that resolved with treatment. She was noted to have a fever of 103 on admission that has since decreased to 100.5 and now afebrile 98.7. Primary team initially attributed her AMS to a UTI, however despite treatment for the UTI her mental status continued to decline over the past 2 days. Brain MRI performed tonight showing large acute intraparenchymal hemorrhage within frontal and temporal lobes, as well as IVH, SAH, Ischemia of the frontal lobes, and edema with 7mm R-to-L Midline shift, R uncal herniation, and R-to-L subfalcine herniation. Primary team consulted Neurosurgery, who found patient to be unresponsive and having vomit on side of face. He transferred her emergently to the ICU where PCCM consulted. I arrived and emergently intubated patient. Neurosurgeon then discussed craniotomy with patient's family on the phone, stating that her function status would never be what it was before. Family elects not to proceed to surgery; they have changed code status to limited. Starting hypertonic saline now.   PAST MEDICAL HISTORY :  She  has a past medical history of Blood clots in brain, CKD (chronic kidney disease) stage V requiring chronic dialysis (HCC), Diabetes mellitus type 2, insulin dependent (HCC), Gait disturbance, post-stroke, History of CVA (cerebrovascular accident), HTN (hypertension), and Seizures (HCC).  PAST SURGICAL HISTORY: She  has a past surgical history that includes [facility] and AV fistula placement.  No Known Allergies  No current  facility-administered medications on file prior to encounter.    Current Outpatient Medications on File Prior to Encounter  Medication Sig  . amLODipine (NORVASC) 5 MG tablet Take 5 mg by mouth daily.   [name] 1 GM 210 MG(Fe) tablet Take 840 mg by mouth 3 (three) times daily with meals.  [name] LANTUS SOLOSTAR 100 UNIT/ML Solostar Pen Inject 10 Units into the skin at bedtime. (Patient taking differently: Inject 16 Units into the skin at bedtime. )  . levETIRAcetam (KEPPRA) 1000 MG tablet Take 1 tablet (1,000 mg total) by mouth daily.  [name] levETIRAcetam (KEPPRA) 500 MG tablet Take 1 tablet (500 mg total) by mouth every Monday, Wednesday, and Friday at 6 PM.   FAMILY HISTORY:  Her family history is not on file.  SOCIAL HISTORY: She  reports that she has been smoking cigarettes and pipe. She has been smoking about 0.50 packs per day. She has never used smokeless tobacco. She reports that she does not drink alcohol or use drugs.  REVIEW OF SYSTEMS:   Review of Systems  Unable to perform ROS: Critical illness   SUBJECTIVE:  Intubated, Unresponsive  VITAL SIGNS: BP (!) 139/100 (BP Location: Left Arm) Comment: recheck  Pulse 98   Temp 98.7 F (37.1 C) (Axillary) Comment: recheck  Resp 20   Ht 5\' 2"  (1.575 m)   Wt 37.8 kg   SpO2 100%   BMI 15.24 kg/m   VENTILATOR SETTINGS: Vent Mode: PRVC FiO2 (%):  [40 %] 40 % Set Rate:  [15 bmp] 15 bmp Vt Set:  [400 mL] 400 mL PEEP:  [5 cmH20] 5 cmH20 Plateau Pressure:  [27 cmH20] 27 cmH20  INTAKE / OUTPUT: I/O last 3 completed shifts: In: 480 [P.O.:260; Other:120; IV Piggyback:100] Out: 1006 [Other:1006]  PHYSICAL EXAMINATION: General: Thin cachectic adult female, appears older than stated age, chronically ill appearing, now intubated and critically ill Neuro: eyes open but unresponsive to voice or sternal rub, GCS 6, not tracking, left pupil surgically misshapen, right pupil 4mm and unreactive.  HEENT: OP clear, MM moist, Poor dentition;  Orally intubated  Cardiovascular: Tachycardic with a regular rhythm, no m/r/g Lungs: Coarse breath sounds with Rhonchi b/l Abdomen: Soft flat NTND Musculoskeletal: no LE edema Skin: no rashes   LABS:  BMET Recent Labs  Lab 11/23/2017 1330 11/13/17 0909  NA 134* 137  K 4.8 3.6  CL 91* 93*  CO2 28 25  BUN 40* 23*  CREATININE 7.45* 4.06*  GLUCOSE 248* 274*   Electrolytes Recent Labs  Lab 11/28/2017 1330 11/13/17 0909  CALCIUM 9.6 9.5  PHOS  --  5.0*   CBC Recent Labs  Lab 11/05/2017 1237  WBC 6.6  HGB 12.3  HCT 37.7  PLT 107*   Coag's Recent Labs  Lab 11/18/2017 1330  INR 1.03   Sepsis Markers Recent Labs  Lab 11/08/2017 1248 11/19/2017 1644  LATICACIDVEN 1.80 3.29*   ABG No results for input(s): PHART, PCO2ART, PO2ART in the last 168 hours.  Liver Enzymes Recent Labs  Lab 11/18/2017 1330 11/13/17 0909  AST 39 32  ALT 42 34  ALKPHOS 132* 105  BILITOT 0.8 1.4*  ALBUMIN 3.8 3.4*   Cardiac Enzymes No results for input(s): TROPONINI, PROBNP in the last 168 hours.  Glucose Recent Labs  Lab 11/29/2017 1708 11/13/17 0108 11/13/17 0732 11/13/17 1337 11/13/17 1704  GLUCAP 208* 135* 256* 293* 353*   Imaging Mr Brain Wo Contrast  Result Date: 11/05/2017 CLINICAL DATA:  57 y/o F; history of diabetes presenting with hypoglycemia, persistent confusion, fever. EXAM: MRI HEAD WITHOUT CONTRAST TECHNIQUE: Multiplanar, multiecho pulse sequences of the brain and surrounding structures were obtained without intravenous contrast. COMPARISON:  05/12/2017 CT head FINDINGS: Brain: There are regions of acute intra-axial hemorrhage within the right anterior frontal lobe, right anterior temporal lobe, and 3 foci within the left anterior frontal lobe. The right anterior frontal lobe hemorrhage measures up to 5.1 cm. The left anterior frontal lobe hemorrhages measures up to 3.6 cm, 1.9 cm, and 1.6 cm. The right anterior temporal lobe hemorrhage measures up to 3.6 cm. (Series 10  images 11, 12, 15, 22). Hemorrhages measured on the T2 weighted sequence with measurement of the low T2 signal component of the hemorrhages. The hemorrhage is also demonstrate heterogeneous T1 weighted signal. The right anterior frontal lobe hemorrhage has decompressed into the ventricular system and there is intraventricular and subarachnoid hemorrhage diffusely throughout the sulci best visualized on the susceptibility weighted sequences. There are blood fluid levels in the occipital horns of lateral ventricles. Additionally, there is a subdural hematoma over the right cerebral convexity measuring 3 mm in thickness and tiny thin subdural hematoma over the left cerebral convexity best appreciated in the left middle cranial fossa (series 11, image 9). Severe edema is associated with the areas of brain parenchymal hemorrhage. Combined with mass effect from the subdural hematoma over the right cerebral convexity there is partial effacement of the right lateral ventricle, 7 mm of right-to-left midline shift, right to left subfalcine herniation, and mild right-sided uncal herniation. No downward herniation at this time. There is diffuse reduced diffusion surrounding the regions of hemorrhage within the anterior frontal lobes bilaterally as well  as the right anterior temporal lobe compatible with acute/early subacute infarction. The pattern of findings probably represents traumatic brain injury and cortical contusions, less likely multiple foci of spontaneous hemorrhage. There are chronic postsurgical changes within the left frontal bone from prior craniotomy with subjacent encephalomalacia. 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 Small focus of chronic encephalomalacia within the left lateral temporal lobe noted. Vascular: Normal flow voids. Skull and upper  cervical spine: No acute finding. Sinuses/Orbits: Negative. Other: None. IMPRESSION: 1. Large intra-axial acute hemorrhages within the bilateral anterior frontal lobes and temporal lobes, greater on the right. 2. Thin right convexity subdural hematoma and tiny subdural hematoma in the left middle cranial fossa. 3. Intraventricular and subarachnoid hemorrhage. 4. Extensive reduced diffusion within the anterior frontal lobes and right anterior temporal lobe surrounding the hematomas compatible with ischemia. 5. Edema and mass with partial effacement of right lateral ventricle, 7 mm right-to-left midline shift, right-sided uncal herniation, and right to left subfalcine herniation. No downward herniation at this time. 6. Findings likely represent sequelae of traumatic brain injury, unlikely multiple spontaneous foci of hemorrhage and stroke. 7. Asymmetric volume loss within the right cerebellar hemisphere which may represent sequelae of crossed cerebellar diaschisis in the setting of chronic seizure, prior infection/inflammation, or stroke. These results were called by telephone at the time of interpretation on 11/24/2017 at 1:24 am to Dr. Clearence PedSchorr, who verbally acknowledged these results. Electronically Signed   By: Mitzi HansenLance  Furusawa-Stratton M.D.   On: 11/24/2017 01:37   Dg Chest Port 1 View  Result Date: 11/25/2017 CLINICAL DATA:  Endotracheal tube adjustment. EXAM: PORTABLE CHEST 1 VIEW COMPARISON:  Chest radiograph performed earlier today at 2:56 a.m. FINDINGS: The patient's endotracheal tube is seen ending just above the carina. This could be retracted approximately 3 cm. The enteric tube is noted extending below the diaphragm. There has been re-expansion of the left lung, with minimal residual left-sided atelectasis. The right lung appears clear. No pleural effusion or pneumothorax is seen The cardiomediastinal silhouette is normal in size. No acute osseous abnormalities are identified. External pacing pads are  noted. A vascular stent is noted at the right axilla. IMPRESSION: 1. Endotracheal tube seen ending just above the carina. This could be retracted approximately 3 cm. 2. Interval re-expansion of the left lung, with minimal residual left-sided atelectasis. These results were called by telephone at the time of interpretation on 11/06/2017 at 3:21 am to [id number], who verbally acknowledged these results. Electronically Signed   By: Roanna RaiderJeffery  Chang M.D.   On: 11/13/2017 03:25   Dg Chest Port 1 View  Result Date: 11/05/2017 CLINICAL DATA:  Endotracheal tube placement. EXAM: PORTABLE CHEST 1 VIEW COMPARISON:  Chest radiograph performed 2018/02/01 FINDINGS: The patient's endotracheal tube is seen ending at the right mainstem bronchus. This should be retracted approximately 7 cm. The patient's enteric tube is noted extending below the diaphragm. Dense atelectasis is noted at the left lung, with leftward mediastinal shift. The right lung appears clear. No pneumothorax is seen. A vascular stent is noted along the right axilla. The cardiomediastinal silhouette is not well assessed due to opacification of the left hemithorax. No acute osseous abnormalities are identified. IMPRESSION: 1. Endotracheal tube seen ending at the right mainstem bronchus. This should be retracted approximately 7 cm. 2. Dense atelectasis at the left lung, with leftward mediastinal shift. These results were called by telephone at  the time of interpretation on 12/01/17 at 3:21 am to Nursing on [facility], who verbally acknowledged these results. Electronically Signed   By: Roanna Raider M.D.   On: December 01, 2017 03:23   CULTURES: Blood cultures (8/9): no growth Sputum culture (8/11): ordered Urine culture (8/11): ordered   ANTIBIOTICS: Ceftriaxone 8/9>>  SIGNIFICANT EVENTS: 8/9: presented to ER with AMS and Fever 8/11: transferred to ICU with ICH, GCS 6 >> emergently intubated   LINES/TUBES: RUE AV Fistula LUE PIV Foley catheter  8/11>> ETT 8/11>> OG tube 8/11>>  DISCUSSION: 57yoF with hx HTN, DM, SZ, prior CVA, ESRD on HD, and HCV who was admitted 8/9 with AMS that began 8/8 PM. At the time of admission, patient was hypoglycemic with BG 29 that resolved with treatment. She was noted to have a fever of 103 on admission that has since decreased to 100.5 and now afebrile 98.7. Primary team initially attributed her AMS to a UTI, however despite treatment for the UTI her mental status continued to decline over the past 2 days. Brain MRI performed tonight showing large acute intraparenchymal hemorrhage within frontal and temporal lobes, as well as IVH, SAH, Ischemia of the frontal lobes, and edema with 7mm R-to-L Midline shift, R uncal herniation, and R-to-L subfalcine herniation. Primary team consulted Neurosurgery, who found patient to have GCS 3 and transferred her emergently to the ICU where PCCM consulted. I arrived and emergently intubated patient. Neurosurgeon then discussed craniotomy with patient's family on the phone, stating that her function status would never be what it was before. Family elects not to proceed to surgery; they have changed code status to limited. Starting hypertonic saline now.   ASSESSMENT / PLAN:  PULMONARY 1. Acute Respiratory failure; Questionable Aspiration event: - intubated for airway protection; CXR post intubation with R mainstem intubation; retracted ETT 2cm and repeat CXR on my review shows ETT just above the carina, which is acceptable. Will NOT retract ETT any further at this point; secured at 21cm at the gum.  - ABG post intubation reviewed; wean FIO2 as tolerated  CARDIOVASCULAR 1. Hx HTN: - remains HYPERtensive pre and post intubation; continue home med Norvasc 5mg  daily - Hydralazine IV PRN  RENAL 1. ESRD on HD: - no need for emergent dialysis at this time; rest per renal  GASTROINTESTINAL 1. Hx HCV - continue to monitor  HEMATOLOGIC 1. 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   INFECTIOUS: 1. Sepsis:  - unclear source; UA on admission with 11-20 WBC but 21-50 epith cells so was a poor contaminated sample; had rare bacteria and moderate leuk but no nitrites. This may be more consistent with colonization than infxn. Obtain new UA now after foley placed. - blood cultures remain no growth. - obtain sputum culture - given the witnessed vomiting with emesis on her face while having GCS of 6, I am highly suspicious for possible aspiration. She did have some food particles in posterior oropharynx during intubation that were suctioned and removed. CXR shows no infiltrates but is high risk for developing an aspiration pna following this questionable aspiration event. Will change antibiotics from Ceftriaxone to Zosyn. MRSA nares negative.  - lactate 1.4; procal pending.  - it may be that her fever on admission was due to an ICH that had already started at that time but was not yet diagnosed.   ENDOCRINE 1. DM: - BG increasing 293>>353>>487 despite SSI; will start insulin gtt  NEUROLOGIC 1. Large Intraparenchymal hemorrhage with IVH and SAH; Hx CVA; Hx SZ's: - Neurosurgery consulted and discussed option of craniotomy with patient's family over the phone but felt patient would never return to her prior baseline status; family decided against craniotomy and has made patient partial code - start 3% saline @ 50cc/hr - continue keppra  - will place palliative are consult  - rest per Neurosurgery  FAMILY  - Updates: no family present in ICU room at time of my exam - Inter-disciplinary family meet or Palliative Care meeting due by: 11/20/17   60 minutes nonprocedural critical care time  Milana Obey, MD  Pulmonary and Critical Care Medicine [facility] Pager: [phone]  12/01/2017, 3:31 AM

## 2017-11-14 NOTE — Progress Notes (Addendum)
[facility] Progress Note   Subjective:   Intubated and sedated.  Objective Vitals:   11/16/2017 1025 11/07/2017 1100 11/22/2017 1153 12/03/2017 1200  BP: 137/71 136/74 (!) 144/73 (!) 142/63  Pulse:  66 66 65  Resp:  (!) _0 Temp:    98.5 F (36.9 C)  TempSrc:    Oral  SpO2:  100% 100% 100%  Weight:      Height:       Physical Exam General:Intubated and sedated Heart:RRR Lungs:CTAB anterior/laterally Abdomen:soft, NTND Extremities:no LE edema Dialysis Access: RU AVF +b/t   Filed Weights   11/13/17 0035 11/13/17 0112 12/01/2017 0246  Weight: 40.5 kg 40.5 kg 37.8 kg    Intake/Output Summary (Last 24 hours) at 11/25/2017 1233 Last data filed at 11/21/2017 1030 Gross per 24 hour  Intake 229.19 ml  Output -  Net 229.19 ml    Additional Objective Labs: Basic Metabolic Panel: Recent Labs  Lab 11/26/2017 1330 11/13/17 0909 11/30/2017 0334 11/25/2017 0444 11/18/2017 0952  NA 134* 137 134* 132* 140  K 4.8 3.6 4.0  --   --   CL 91* 93* 88*  --   --   CO2 28 25 21*  --   --   GLUCOSE 248* 274* 481*  --   --   BUN 40* 23* 48*  --   --   CREATININE 7.45* 4.06* 6.53*  --   --   CALCIUM 9.6 9.5 9.5  --   --   PHOS  --  5.0* 3.9  --   --    Liver Function Tests: Recent Labs  Lab 11/24/2017 1330 11/13/17 0909 11/04/2017 0334  AST 39 32 33  ALT 42 34 31  ALKPHOS 132* 105 103  BILITOT 0.8 1.4* 1.3*  PROT 7.2 7.1 7.3  ALBUMIN 3.8 3.4* 3.3*   No results for input(s): LIPASE, AMYLASE in the last 168 hours. CBC: Recent Labs  Lab 12/04/2017 1237 11/09/2017 0334  WBC 6.6 17.0*  NEUTROABS 4.9 14.0*  HGB 12.3 12.4  HCT 37.7 38.4  MCV 93.1 91.4  PLT 107* 147*   Blood Culture    Component Value Date/Time   SDES BLOOD LEFT HAND 11/05/2017 1314   SPECREQUEST  11/09/2017 1314    BOTTLES DRAWN AEROBIC ONLY Blood Culture results may not be optimal due to an inadequate volume of blood received in culture bottles   CULT  11/06/2017 1314    NO GROWTH < 24  HOURS Performed at [facility], [place] [address], [place], [place] [address]    REPTSTATUS PENDING 12/03/2017 1314    Cardiac Enzymes: Recent Labs  Lab 11/09/2017 0334 11/21/2017 0952  TROPONINI 0.14* 0.11*   CBG: Recent Labs  Lab 11/28/2017 0707 11/19/2017 0809 11/18/2017 0913 11/07/2017 1007 11/23/2017 1109  GLUCAP 485* 456* 365* 363* 331*    Lab Results  Component Value Date   INR 1.05 11/18/2017   INR 1.03 11/22/2017   Studies/Results: Mr Brain Wo Contrast  Result Date: 11/20/2017 CLINICAL DATA:  57 y/o F; history of diabetes presenting with hypoglycemia, persistent confusion, fever. EXAM: MRI HEAD WITHOUT CONTRAST TECHNIQUE: Multiplanar, multiecho pulse sequences of the brain and surrounding structures were obtained without intravenous contrast. COMPARISON:  05/12/2017 CT head FINDINGS: Brain: There are regions of acute intra-axial hemorrhage within the right anterior frontal lobe, right anterior temporal lobe, and 3 foci within the left anterior frontal lobe. The right anterior frontal lobe hemorrhage measures up to 5.1 cm. The left  anterior frontal lobe hemorrhages measures up to 3.6 cm, 1.9 cm, and 1.6 cm. The right anterior temporal lobe hemorrhage measures up to 3.6 cm. (Series 10 images 11, 12, 15, 22). Hemorrhages measured on the T2 weighted sequence with measurement of the low T2 signal component of the hemorrhages. The hemorrhage is also demonstrate heterogeneous T1 weighted signal. The right anterior frontal lobe hemorrhage has decompressed into the ventricular system and there is intraventricular and subarachnoid hemorrhage diffusely throughout the sulci best visualized on the susceptibility weighted sequences. There are blood fluid levels in the occipital horns of lateral ventricles. Additionally, there is a subdural hematoma over the right cerebral convexity measuring 3 mm in thickness and tiny thin subdural hematoma over the left cerebral convexity best appreciated in the  left middle cranial fossa (series 11, image 9). Severe edema is associated with the areas of brain parenchymal hemorrhage. Combined with mass effect from the subdural hematoma over the right cerebral convexity there is partial effacement of the right lateral ventricle, 7 mm of right-to-left midline shift, right to left subfalcine herniation, and mild right-sided uncal herniation. No downward herniation at this time. There is diffuse reduced diffusion surrounding the regions of hemorrhage within the anterior frontal lobes bilaterally as well as the right anterior temporal lobe compatible with acute/early subacute infarction. The pattern of findings probably represents traumatic brain injury and cortical contusions, less likely multiple foci of spontaneous hemorrhage. There are chronic postsurgical changes within the left frontal bone from prior craniotomy with subjacent encephalomalacia. 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 Small focus of chronic encephalomalacia within the left lateral temporal lobe noted. Vascular: Normal flow voids. Skull and upper cervical spine: No acute finding. Sinuses/Orbits: Negative. Other: None. IMPRESSION: 1. Large intra-axial acute hemorrhages within the bilateral anterior frontal lobes and temporal lobes, greater on the right. 2. Thin right convexity subdural hematoma and tiny subdural hematoma in the left middle cranial fossa. 3. Intraventricular and subarachnoid hemorrhage. 4. Extensive reduced diffusion within the anterior frontal lobes and right anterior temporal lobe surrounding the hematomas compatible with ischemia. 5. Edema and mass with partial effacement of right lateral ventricle, 7 mm right-to-left midline shift, right-sided uncal herniation, and right to left subfalcine herniation. No downward herniation at  this time. 6. Findings likely represent sequelae of traumatic brain injury, unlikely multiple spontaneous foci of hemorrhage and stroke. 7. Asymmetric volume loss within the right cerebellar hemisphere which may represent sequelae of crossed cerebellar diaschisis in the setting of chronic seizure, prior infection/inflammation, or stroke. These results were called by telephone at the time of interpretation on 11/08/2017 at 1:24 am to Dr. Hilbert Bible, who verbally acknowledged these results. Electronically Signed   By: Kristine Garbe M.D.   On: 12/04/2017 01:37   Dg Chest Port 1 View  Result Date: 12/01/2017 CLINICAL DATA:  Endotracheal tube adjustment. EXAM: PORTABLE CHEST 1 VIEW COMPARISON:  Chest radiograph performed earlier today at 2:56 a.m. FINDINGS: The patient's endotracheal tube is seen ending just above the carina. This could be retracted approximately 3 cm. The enteric tube is noted extending below the diaphragm. There has been re-expansion of the left lung, with minimal residual left-sided atelectasis. The right lung appears clear. No pleural effusion or pneumothorax is seen The cardiomediastinal silhouette is normal in size. No acute osseous abnormalities are identified. External pacing pads are noted. A vascular stent is noted at the right axilla.  IMPRESSION: 1. Endotracheal tube seen ending just above the carina. This could be retracted approximately 3 cm. 2. Interval re-expansion of the left lung, with minimal residual left-sided atelectasis. These results were called by telephone at the time of interpretation on 11/09/2017 at 3:21 am to Nursing on [facility], who verbally acknowledged these results. Electronically Signed   By: Garald Balding M.D.   On: 11/29/2017 03:25   Dg Chest Port 1 View  Result Date: 11/23/2017 CLINICAL DATA:  Endotracheal tube placement. EXAM: PORTABLE CHEST 1 VIEW COMPARISON:  Chest radiograph performed 11/22/2017 FINDINGS: The patient's endotracheal tube is seen ending  at the right mainstem bronchus. This should be retracted approximately 7 cm. The patient's enteric tube is noted extending below the diaphragm. Dense atelectasis is noted at the left lung, with leftward mediastinal shift. The right lung appears clear. No pneumothorax is seen. A vascular stent is noted along the right axilla. The cardiomediastinal silhouette is not well assessed due to opacification of the left hemithorax. No acute osseous abnormalities are identified. IMPRESSION: 1. Endotracheal tube seen ending at the right mainstem bronchus. This should be retracted approximately 7 cm. 2. Dense atelectasis at the left lung, with leftward mediastinal shift. These results were called by telephone at the time of interpretation on 11/19/2017 at 3:21 am to Nursing on [facility], who verbally acknowledged these results. Electronically Signed   By: Garald Balding M.D.   On: 12/01/2017 03:23    Medications: . sodium chloride    . sodium chloride    . sodium chloride    . fentaNYL infusion INTRAVENOUS 25 mcg/hr (12/01/2017 1030)  . insulin (NOVOLIN-R) infusion 9.8 Units/hr (11/25/2017 1204)  . piperacillin-tazobactam (ZOSYN)  IV    . sodium chloride (hypertonic) 50 mL/hr at 11/10/2017 0700   . amLODipine  5 mg Oral Daily  . chlorhexidine gluconate (MEDLINE KIT)  15 mL Mouth Rinse BID  . Chlorhexidine Gluconate Cloth  6 each Topical Q0600  . [START ON 2017-12-04] doxercalciferol  2 mcg Intravenous Q M,W,F-HD  . [START ON 12-04-2017] famotidine  20 mg Per Tube Daily  . ferric citrate  840 mg Oral TID WC  . levETIRAcetam  1,000 mg Per Tube Daily  . mouth rinse  15 mL Mouth Rinse 10 times per day    Dialysis Orders: MWF -NW 3.5hrs, BFR450, DFRAF1.5, EDW 40.5kg,2K/2Ca  Access:RU AVG [id number] Unit bolus Hectorol7mg IV qHD   Assessment/Plan: 1. AMS -  MRI completed yesterday showed large intra-axial hemorrhage w/in b/l anterior frontal lobes, SAH, IVH, ischemia w/in R anterior frontal lobes &  temporal lobe, and significant R->L shift. Now intubated and non responsive.  Family elected not to have surgery due to poor prognosis. Now partial code.  Per neurosurgery/CCM. 2. Fever - Tmax 100.4 overnight.  BC NGTD.  3. ESRD -HD MWF. Plan to hold off on dialysis for now due to patient critical condition.  Will sign off, please call as needed.  4. Hypertension/volume - BP variable. Does not appear volume overloaded on exam.  5. Anemia of CKD - Hgb 12.4. No indication for ESAat this time 6. Metabolic bone disease - Ca 9.5. Ordered phos. Continue VDRA if dialysis resumed. 7. Nutrition - Alb 3.4. Intubated.  8. Seizure disorder - on keppra. per primary 9. DMT2 - BS ^. On insulin. Per primary. 10. Hep C - per primary 11. Hx CVA 12. Dispo: Terminal prognosis per neurosurgery.  Family to come and discuss goals of care.    LJen Mow PA-C [employer]  Pager: [phone] 11/27/2017,12:33 PM  LOS: 2 days   Pt seen, examined and agree w A/P as above.  Kelly Splinter MD [employer] pager [phone]   11/13/2017, 2:03 PM

## 2017-11-14 NOTE — Progress Notes (Signed)
Alexis Sims  ZOX:[id number] DOB: 1961-01-13 DOA: 12/02/17 PCP: Fleet Contras, MD    Brief Narrative:  The patient is a 57 year old chronically ill woman who was admitted with altered mental status.  This was originally ascribed to hypoglycemia and a possible infection.  Unfortunately her mental status failed to improve and she underwent an MRI which revealed a massive intraparenchymal hemorrhage involving both lobes intraventricular extension and subfalcine herniation.  Neurosurgery has declined any intervention.  Palliative care has been involved in the family is realistic to expect the patient to pass away. Significant past medical history includes hypertension, diabetes, seizure disorder, prior CVA, ESRD on hemodialysis.  Subjective: Patient was intubated and brought to the ICU.  There has been ongoing discussions with the family regarding goals of care.  Assessment & Plan:  Catastrophic intraventricular hemorrhage with brainstem compression.   This is an unsurvivable event.  The patient nearly meets criteria for death by neurological criteria.  Unfortunately she received fentanyl by infusion for some time which may have acutely given her renal failure. Therefore formal brain death testing should be deferred until the morning of August 12.  Acute respiratory failure secondary to inability to protect airway.  Remains on nominal ventilator settings.  Is unable to trigger ventilator spontaneously.  End-stage renal disease on hemodialysis.  Further dialysis will be deferred based on goals of care.  DVT prophylaxis: Not indicated due to bleed GI prophylaxis:  famotidine Diet: NPO Mobility: not appropriate Code Status: DNR. Family Communication: Family updated at bedside. Disposition Plan: for likely withdrawal of care.  Consultants:  Palliative care  Objective: Blood pressure (!) 114/56, pulse 74, temperature 98.5 F (36.9 C), temperature source Oral, resp. rate 15, height  5\' 2"  (1.575 m), weight 37.8 kg, SpO2 100 %.    Vent Mode: PRVC FiO2 (%):  [30 %-40 %] 30 % Set Rate:  [15 bmp] 15 bmp Vt Set:  [400 mL] 400 mL PEEP:  [5 cmH20] 5 cmH20 Plateau Pressure:  [15 cmH20-27 cmH20] 16 cmH20   Intake/Output Summary (Last 24 hours) at 11/28/2017 1848 Last data filed at 12/03/2017 1800 Gross per 24 hour  Intake 733.8 ml  Output 0 ml  Net 733.8 ml   Filed Weights   11/13/17 0035 11/13/17 0112 11/22/2017 0246  Weight: 40.5 kg 40.5 kg 37.8 kg    Examination: General: frail appearing HENT: OGT and ETT in place. Lungs: Clear no ventilator dyssynchrony. Cardiovascular: Extremities warm. HS normal. Abdomen: Soft Extremities: No visible deformities. Neuro: Examination performed off fentanyl.  Right pupil is postsurgical.  Left pupil is not responsive to light at 4 mm in diameter.  No cough no gag no corneal reflex no response to pain full stimuli in the limbs. GU: Foley catheter in place.  CBC: Recent Labs  Lab December 02, 2017 1237 11/20/2017 0334  WBC 6.6 17.0*  NEUTROABS 4.9 14.0*  HGB 12.3 12.4  HCT 37.7 38.4  MCV 93.1 91.4  PLT 107* 147*   Basic Metabolic Panel: Recent Labs  Lab 12-02-17 1330 11/13/17 0909 11/28/2017 0334 11/29/2017 0444 11/22/2017 0952 11/09/2017 1600  NA 134* 137 134* 132* 140 145  K 4.8 3.6 4.0  --   --   --   CL 91* 93* 88*  --   --   --   CO2 28 25 21*  --   --   --   GLUCOSE 248* 274* 481*  --   --   --   BUN 40* 23* 48*  --   --   --  CREATININE 7.45* 4.06* 6.53*  --   --   --   CALCIUM 9.6 9.5 9.5  --   --   --   MG  --   --  2.5*  --   --   --   PHOS  --  5.0* 3.9  --   --   --    GFR: Estimated Creatinine Clearance: 5.7 mL/min (A) (by C-G formula based on SCr of 6.53 mg/dL (H)). Recent Labs  Lab 11/22/2017 1237 11/28/2017 1248 11/20/2017 1644 03-Mar-2018 0334  PROCALCITON  --   --   --  2.54  WBC 6.6  --   --  17.0*  LATICACIDVEN  --  1.80 3.29* 1.4   Liver Function Tests: Recent Labs  Lab 11/18/2017 1330 11/13/17 0909  03-Mar-2018 0334  AST 39 32 33  ALT 42 34 31  ALKPHOS 132* 105 103  BILITOT 0.8 1.4* 1.3*  PROT 7.2 7.1 7.3  ALBUMIN 3.8 3.4* 3.3*   No results for input(s): LIPASE, AMYLASE in the last 168 hours. No results for input(s): AMMONIA in the last 168 hours.  Coagulation Profile: Recent Labs  Lab 11/27/2017 1330 03-Mar-2018 0334  INR 1.03 1.05    Cardiac Enzymes: Recent Labs  Lab 03-Mar-2018 0334 03-Mar-2018 0952 03-Mar-2018 1600  TROPONINI 0.14* 0.11* 0.09*    HbA1C: Hgb A1c MFr Bld  Date/Time Value Ref Range Status  05/12/2017 05:42 PM 10.4 (H) 4.8 - 5.6 % Final    Comment:    (NOTE) Pre diabetes:          5.7%-6.4% Diabetes:              >6.4% Glycemic control for   <7.0% adults with diabetes     CBG: Recent Labs  Lab 03-Mar-2018 1109 03-Mar-2018 1202 03-Mar-2018 1305 03-Mar-2018 1453 03-Mar-2018 1626  GLUCAP 331* 256* 221* 152* 110*   CRITICAL CARE Performed by: Lynnell Catalanavi Theodore Virgin   Total critical care time: 40 minutes  Critical care time was exclusive of separately billable procedures and treating other patients.  Critical care was necessary to treat or prevent imminent or life-threatening deterioration.  Critical care was time spent personally by me on the following activities: development of treatment plan with patient and/or surrogate as well as nursing, discussions with consultants, evaluation of patient's response to treatment, examination of patient, obtaining history from patient or surrogate, ordering and performing treatments and interventions, ordering and review of laboratory studies, ordering and review of radiographic studies, pulse oximetry and re-evaluation of patient's condition.   LOS: 2 days    Lynnell Catalanavi Allesandra Huebsch, MD [facility] ICU Physician [facility]  Pager: [phone] Mobile: [phone] After hours: [phone].

## 2017-11-14 NOTE — Procedures (Signed)
Endotracheal Intubation Procedure Note Indication for endotracheal intubation: airway compromise Sedation: etomidate and midazolam Paralytic: succinylcholine Equipment: Macintosh 3 laryngoscope blade and 7.105mm cuffed endotracheal tube Cricoid Pressure: yes Number of attempts: 1 ETT location confirmed by by auscultation, by CXR and ETCO2 monitor. Very anterior cords on exam. ETT secured at 23cm >> found to be right mainstem intubation on CXR >> retracted to 21cm. CXR repeated.  Patient tolerated procedure well.

## 2017-11-14 NOTE — Progress Notes (Signed)
Shift event note: Notified by Dr Harrie JeansStratton w/ Neuro-radiology service regarding acute findings on MRI brain. Multiple acute changes c/w new traumatic brain injury. Discussed pt w/ Dr Marilynn Railostello w/ neurosurgery service who has agreed to see pt tonight. Requested orders be placed for CTA brain. RN updated w/ plan. Per RN pt remains essentially nonverbal but will open eyes to voice. Admits there has been a deterioration in pt's neurological status earlier today compared to yesterday. Remains somewhat hypertensive (not new), remaining VSS. Management per neurosurgery appreciated. Will follow.   Alexis ChangKatherine P. Calieb Lichtman, NP [facility] Pager [phone]

## 2017-11-14 NOTE — Progress Notes (Signed)
Pt back to floor from MRI. Awake.

## 2017-11-14 NOTE — Progress Notes (Signed)
Pharmacy Antibiotic Note  Alexis ChengDora Sims is a 57 y.o. female admitted on 12/03/2017 with AMS, now w/ new SAH and not protecting airway >> sent to ICU and emergently intubated, w/ concern for aspiration pneumonia.  Pharmacy has been consulted for Zosyn dosing.  Plan: Zosyn 3.375g IV Q12H (4-hour infusion).  Height: 5\' 2"  (157.5 cm) Weight: 83 lb 5.3 oz (37.8 kg) IBW/kg (Calculated) : 50.1  Temp (24hrs), Avg:99.3 F (37.4 C), Min:98.1 F (36.7 C), Max:100.4 F (38 C)  Recent Labs  Lab 12/02/2017 1237 11/17/2017 1248 11/08/2017 1330 11/23/2017 1644 11/13/17 0909 2018/02/20 0334  WBC 6.6  --   --   --   --  17.0*  CREATININE  --   --  7.45*  --  4.06* 6.53*  LATICACIDVEN  --  1.80  --  3.29*  --  1.4    Estimated Creatinine Clearance: 5.7 mL/min (A) (by C-G formula based on SCr of 6.53 mg/dL (H)).    No Known Allergies   Thank you for allowing pharmacy to be a part of this patient's care.  Alexis GamblesVeronda [name], PharmD, BCPS  01/31/18 5:35 AM

## 2017-11-14 NOTE — Progress Notes (Signed)
Neurosurgeon on the floor to see pt. Pt was noted to have thrown up and not responding. Notified rapid response.

## 2017-11-14 NOTE — Progress Notes (Signed)
Dr. Denese KillingsAgarwala made aware patient Na+ 145. Patient currently on 3% at 50cc/hr. Rate decreased to 25cc/hr per MD. Also spot checked patient CBG since patient NPO and off insulin gtt with last CBG 110. CBG =133. Will resume Q4h CBG with sliding scale coverage at 2000.

## 2017-11-14 NOTE — Progress Notes (Signed)
Fleet ContrasRachel (pharmacist) made aware patient Na+ 140 from 132 within 6 hrs. Nursing to contact MD for orders. Continuing to monitor.

## 2017-11-14 NOTE — Consult Note (Addendum)
Result Value Ref Range   HIV Screen 4th Generation wRfx Non Reactive Non Reactive    Comment: (NOTE) Performed At: [facility] [place], [place] [id number] Rush Farmer MD [id number]   Glucose, capillary     Status: Abnormal   Collection Time: 11/13/17  1:08 AM  Result Value Ref Range   Glucose-Capillary 135 (H) 70 - 99 mg/dL  MRSA PCR Screening     Status: None   Collection Time: 11/13/17  5:08 AM  Result Value Ref Range   MRSA by PCR NEGATIVE NEGATIVE    Comment:        The GeneXpert MRSA Assay (FDA approved for NASAL specimens only), is one component of a comprehensive MRSA colonization surveillance program. It is not intended to diagnose MRSA infection nor to guide or monitor treatment for MRSA infections. Performed at [facility], [place] [address] [place], [place] [address]   Glucose, capillary     Status: Abnormal   Collection Time: 11/13/17  7:32 AM  Result Value Ref Range   Glucose-Capillary 256 (H) 70 - 99 mg/dL  Comprehensive metabolic panel     Status: Abnormal   Collection Time: 11/13/17  9:09 AM  Result Value Ref Range   Sodium 137 135 - 145 mmol/L   Potassium 3.6 3.5 - 5.1 mmol/L    Comment: NO VISIBLE HEMOLYSIS   Chloride 93 (L) 98 - 111 mmol/L   CO2 25 22 - 32 mmol/L   Glucose, Bld 274 (H) 70 - 99 mg/dL   BUN 23 (H) 6 - 20 mg/dL   Creatinine, Ser 4.06 (H) 0.44 - 1.00 mg/dL    Comment: DELTA CHECK NOTED   Calcium 9.5 8.9 - 10.3 mg/dL   Total Protein 7.1 6.5 - 8.1 g/dL   Albumin 3.4 (L) 3.5 - 5.0 g/dL   AST 32 15 - 41 U/L   ALT 34 0 - 44 U/L   Alkaline Phosphatase 105 38 - 126 U/L   Total Bilirubin 1.4 (H) 0.3 - 1.2 mg/dL   GFR calc non Af Amer 11 (L) >60 mL/min   GFR calc Af Amer 13 (L) >60 mL/min    Comment: (NOTE) The eGFR has been calculated using the CKD EPI equation. This calculation has not been validated in all clinical situations. eGFR's persistently <60 mL/min signify possible Chronic Kidney Disease.    Anion gap 19 (H) 5 - 15    Comment: Performed at [facility], [place] [address], [place], [place] [address]  Phosphorus     Status: Abnormal   Collection Time: 11/13/17  9:09 AM  Result Value Ref Range   Phosphorus 5.0 (H) 2.5 - 4.6 mg/dL    Comment: Performed at [facility] [address], [place], [place] [address]  Glucose, capillary     Status: Abnormal   Collection Time: 11/13/17  1:37 PM  Result Value Ref Range   Glucose-Capillary 293 (H) 70 - 99 mg/dL  Glucose, capillary     Status: Abnormal   Collection Time: 11/13/17  5:04 PM  Result Value Ref Range   Glucose-Capillary 353 (H) 70 - 99 mg/dL    Dg Chest 2 View  Result Date: 12/02/2017 CLINICAL DATA:  57 year old female with a history altered mental status EXAM: CHEST - 2 VIEW COMPARISON:  05/14/2017 FINDINGS:  Cardiomediastinal silhouette unchanged. No central vascular congestion. No interlobular septal thickening. Improved aeration compared to the prior with resolution of airspace and interstitial opacities. No confluent  [id number]    Report Status PENDING   Comprehensive metabolic panel     Status: Abnormal   Collection Time: 11/11/2017  1:30 PM  Result Value Ref Range   Sodium 134 (L) 135 - 145 mmol/L   Potassium 4.8 3.5 - 5.1 mmol/L   Chloride 91 (L) 98 - 111 mmol/L   CO2 28 22 - 32 mmol/L   Glucose, Bld 248 (H) 70 - 99 mg/dL   BUN 40 (H) 6 - 20 mg/dL   Creatinine, Ser 7.45 (H) 0.44 - 1.00 mg/dL   Calcium 9.6 8.9 - 10.3 mg/dL   Total Protein 7.2 6.5 - 8.1 g/dL   Albumin 3.8 3.5 - 5.0 g/dL   AST 39 15 - 41 U/L   ALT 42 0 - 44 U/L   Alkaline Phosphatase 132 (H) 38 - 126 U/L   Total Bilirubin 0.8 0.3 - 1.2 mg/dL   GFR calc non Af Amer 5 (L) >60 mL/min   GFR calc Af Amer 6 (L) >60 mL/min    Comment: (NOTE) The eGFR has been calculated using the CKD EPI equation. This calculation has not been validated in all clinical situations. eGFR's persistently <60 mL/min signify possible Chronic Kidney Disease.    Anion gap 15 5 - 15    Comment: Performed at [facility] [address] [place], [place] [address]  Protime-INR     Status: None   Collection Time: 12/04/2017  1:30 PM  Result Value Ref Range   Prothrombin Time 13.4 11.4 - 15.2 seconds   INR 1.03     Comment: Performed at [facility] [address] [place], [place] [address]  Urinalysis, Routine w reflex microscopic     Status: Abnormal   Collection Time: 11/26/2017  2:20 PM  Result Value Ref Range   Color, Urine YELLOW YELLOW   APPearance CLOUDY (A) CLEAR   Specific Gravity, Urine 1.015 1.005 - 1.030   pH 8.5 (H) 5.0 - 8.0   Glucose, UA 250 (A) NEGATIVE mg/dL   Hgb urine dipstick LARGE (A) NEGATIVE   Bilirubin Urine NEGATIVE NEGATIVE   Ketones, ur NEGATIVE  NEGATIVE mg/dL   Protein, ur >300 (A) NEGATIVE mg/dL   Nitrite NEGATIVE NEGATIVE   Leukocytes, UA MODERATE (A) NEGATIVE    Comment: Performed at [facility] [address], [place], [place] [address]  Urinalysis, Microscopic (reflex)     Status: Abnormal   Collection Time: 11/21/2017  2:20 PM  Result Value Ref Range   RBC / HPF 6-10 0 - 5 RBC/hpf   WBC, UA 11-20 0 - 5 WBC/hpf   Bacteria, UA RARE (A) NONE SEEN   Squamous Epithelial / LPF 21-50 0 - 5   Amorphous Crystal PRESENT    Urine-Other LESS THAN 10 mL OF URINE SUBMITTED     Comment: MICROSCOPIC EXAM PERFORMED ON UNCONCENTRATED URINE Performed at [facility], [place] [address], [place], [place] [address]   I-Stat CG4 Lactic Acid, ED     Status: Abnormal   Collection Time: 11/23/2017  4:44 PM  Result Value Ref Range   Lactic Acid, Venous 3.29 (HH) 0.5 - 1.9 mmol/L   Comment NOTIFIED PHYSICIAN   Glucose, capillary     Status: Abnormal   Collection Time: 11/17/2017  5:08 PM  Result Value Ref Range   Glucose-Capillary 208 (H) 70 - 99 mg/dL  HIV antibody (Routine Testing)     Status: None   Collection Time: 12/01/2017 10:32 PM  Result Value Ref Range   HIV Screen 4th Generation wRfx Non Reactive Non Reactive    Comment: (NOTE) Performed At: [facility] [place], [place] [id number] Rush Farmer MD [id number]   Glucose, capillary     Status: Abnormal   Collection Time: 11/13/17  1:08 AM  Result Value Ref Range   Glucose-Capillary 135 (H) 70 - 99 mg/dL  MRSA PCR Screening     Status: None   Collection Time: 11/13/17  5:08 AM  Result Value Ref Range   MRSA by PCR NEGATIVE NEGATIVE    Comment:        The GeneXpert MRSA Assay (FDA approved for NASAL specimens only), is one component of a comprehensive MRSA colonization surveillance program. It is not intended to diagnose MRSA infection nor to guide or monitor treatment for MRSA infections. Performed at [facility], [place] [address] [place], [place] [address]   Glucose, capillary     Status: Abnormal   Collection Time: 11/13/17  7:32 AM  Result Value Ref Range   Glucose-Capillary 256 (H) 70 - 99 mg/dL  Comprehensive metabolic panel     Status: Abnormal   Collection Time: 11/13/17  9:09 AM  Result Value Ref Range   Sodium 137 135 - 145 mmol/L   Potassium 3.6 3.5 - 5.1 mmol/L    Comment: NO VISIBLE HEMOLYSIS   Chloride 93 (L) 98 - 111 mmol/L   CO2 25 22 - 32 mmol/L   Glucose, Bld 274 (H) 70 - 99 mg/dL   BUN 23 (H) 6 - 20 mg/dL   Creatinine, Ser 4.06 (H) 0.44 - 1.00 mg/dL    Comment: DELTA CHECK NOTED   Calcium 9.5 8.9 - 10.3 mg/dL   Total Protein 7.1 6.5 - 8.1 g/dL   Albumin 3.4 (L) 3.5 - 5.0 g/dL   AST 32 15 - 41 U/L   ALT 34 0 - 44 U/L   Alkaline Phosphatase 105 38 - 126 U/L   Total Bilirubin 1.4 (H) 0.3 - 1.2 mg/dL   GFR calc non Af Amer 11 (L) >60 mL/min   GFR calc Af Amer 13 (L) >60 mL/min    Comment: (NOTE) The eGFR has been calculated using the CKD EPI equation. This calculation has not been validated in all clinical situations. eGFR's persistently <60 mL/min signify possible Chronic Kidney Disease.    Anion gap 19 (H) 5 - 15    Comment: Performed at [facility], [place] [address], [place], [place] [address]  Phosphorus     Status: Abnormal   Collection Time: 11/13/17  9:09 AM  Result Value Ref Range   Phosphorus 5.0 (H) 2.5 - 4.6 mg/dL    Comment: Performed at [facility] [address], [place], [place] [address]  Glucose, capillary     Status: Abnormal   Collection Time: 11/13/17  1:37 PM  Result Value Ref Range   Glucose-Capillary 293 (H) 70 - 99 mg/dL  Glucose, capillary     Status: Abnormal   Collection Time: 11/13/17  5:04 PM  Result Value Ref Range   Glucose-Capillary 353 (H) 70 - 99 mg/dL    Dg Chest 2 View  Result Date: 12/02/2017 CLINICAL DATA:  57 year old female with a history altered mental status EXAM: CHEST - 2 VIEW COMPARISON:  05/14/2017 FINDINGS:  Cardiomediastinal silhouette unchanged. No central vascular congestion. No interlobular septal thickening. Improved aeration compared to the prior with resolution of airspace and interstitial opacities. No confluent  Result Value Ref Range   HIV Screen 4th Generation wRfx Non Reactive Non Reactive    Comment: (NOTE) Performed At: [facility] [place], [place] [id number] Rush Farmer MD [id number]   Glucose, capillary     Status: Abnormal   Collection Time: 11/13/17  1:08 AM  Result Value Ref Range   Glucose-Capillary 135 (H) 70 - 99 mg/dL  MRSA PCR Screening     Status: None   Collection Time: 11/13/17  5:08 AM  Result Value Ref Range   MRSA by PCR NEGATIVE NEGATIVE    Comment:        The GeneXpert MRSA Assay (FDA approved for NASAL specimens only), is one component of a comprehensive MRSA colonization surveillance program. It is not intended to diagnose MRSA infection nor to guide or monitor treatment for MRSA infections. Performed at [facility], [place] [address] [place], [place] [address]   Glucose, capillary     Status: Abnormal   Collection Time: 11/13/17  7:32 AM  Result Value Ref Range   Glucose-Capillary 256 (H) 70 - 99 mg/dL  Comprehensive metabolic panel     Status: Abnormal   Collection Time: 11/13/17  9:09 AM  Result Value Ref Range   Sodium 137 135 - 145 mmol/L   Potassium 3.6 3.5 - 5.1 mmol/L    Comment: NO VISIBLE HEMOLYSIS   Chloride 93 (L) 98 - 111 mmol/L   CO2 25 22 - 32 mmol/L   Glucose, Bld 274 (H) 70 - 99 mg/dL   BUN 23 (H) 6 - 20 mg/dL   Creatinine, Ser 4.06 (H) 0.44 - 1.00 mg/dL    Comment: DELTA CHECK NOTED   Calcium 9.5 8.9 - 10.3 mg/dL   Total Protein 7.1 6.5 - 8.1 g/dL   Albumin 3.4 (L) 3.5 - 5.0 g/dL   AST 32 15 - 41 U/L   ALT 34 0 - 44 U/L   Alkaline Phosphatase 105 38 - 126 U/L   Total Bilirubin 1.4 (H) 0.3 - 1.2 mg/dL   GFR calc non Af Amer 11 (L) >60 mL/min   GFR calc Af Amer 13 (L) >60 mL/min    Comment: (NOTE) The eGFR has been calculated using the CKD EPI equation. This calculation has not been validated in all clinical situations. eGFR's persistently <60 mL/min signify possible Chronic Kidney Disease.    Anion gap 19 (H) 5 - 15    Comment: Performed at [facility], [place] [address], [place], [place] [address]  Phosphorus     Status: Abnormal   Collection Time: 11/13/17  9:09 AM  Result Value Ref Range   Phosphorus 5.0 (H) 2.5 - 4.6 mg/dL    Comment: Performed at [facility] [address], [place], [place] [address]  Glucose, capillary     Status: Abnormal   Collection Time: 11/13/17  1:37 PM  Result Value Ref Range   Glucose-Capillary 293 (H) 70 - 99 mg/dL  Glucose, capillary     Status: Abnormal   Collection Time: 11/13/17  5:04 PM  Result Value Ref Range   Glucose-Capillary 353 (H) 70 - 99 mg/dL    Dg Chest 2 View  Result Date: 12/02/2017 CLINICAL DATA:  57 year old female with a history altered mental status EXAM: CHEST - 2 VIEW COMPARISON:  05/14/2017 FINDINGS:  Cardiomediastinal silhouette unchanged. No central vascular congestion. No interlobular septal thickening. Improved aeration compared to the prior with resolution of airspace and interstitial opacities. No confluent  [id number]    Report Status PENDING   Comprehensive metabolic panel     Status: Abnormal   Collection Time: 11/11/2017  1:30 PM  Result Value Ref Range   Sodium 134 (L) 135 - 145 mmol/L   Potassium 4.8 3.5 - 5.1 mmol/L   Chloride 91 (L) 98 - 111 mmol/L   CO2 28 22 - 32 mmol/L   Glucose, Bld 248 (H) 70 - 99 mg/dL   BUN 40 (H) 6 - 20 mg/dL   Creatinine, Ser 7.45 (H) 0.44 - 1.00 mg/dL   Calcium 9.6 8.9 - 10.3 mg/dL   Total Protein 7.2 6.5 - 8.1 g/dL   Albumin 3.8 3.5 - 5.0 g/dL   AST 39 15 - 41 U/L   ALT 42 0 - 44 U/L   Alkaline Phosphatase 132 (H) 38 - 126 U/L   Total Bilirubin 0.8 0.3 - 1.2 mg/dL   GFR calc non Af Amer 5 (L) >60 mL/min   GFR calc Af Amer 6 (L) >60 mL/min    Comment: (NOTE) The eGFR has been calculated using the CKD EPI equation. This calculation has not been validated in all clinical situations. eGFR's persistently <60 mL/min signify possible Chronic Kidney Disease.    Anion gap 15 5 - 15    Comment: Performed at [facility] [address] [place], [place] [address]  Protime-INR     Status: None   Collection Time: 12/04/2017  1:30 PM  Result Value Ref Range   Prothrombin Time 13.4 11.4 - 15.2 seconds   INR 1.03     Comment: Performed at [facility] [address] [place], [place] [address]  Urinalysis, Routine w reflex microscopic     Status: Abnormal   Collection Time: 11/26/2017  2:20 PM  Result Value Ref Range   Color, Urine YELLOW YELLOW   APPearance CLOUDY (A) CLEAR   Specific Gravity, Urine 1.015 1.005 - 1.030   pH 8.5 (H) 5.0 - 8.0   Glucose, UA 250 (A) NEGATIVE mg/dL   Hgb urine dipstick LARGE (A) NEGATIVE   Bilirubin Urine NEGATIVE NEGATIVE   Ketones, ur NEGATIVE  NEGATIVE mg/dL   Protein, ur >300 (A) NEGATIVE mg/dL   Nitrite NEGATIVE NEGATIVE   Leukocytes, UA MODERATE (A) NEGATIVE    Comment: Performed at [facility] [address], [place], [place] [address]  Urinalysis, Microscopic (reflex)     Status: Abnormal   Collection Time: 11/21/2017  2:20 PM  Result Value Ref Range   RBC / HPF 6-10 0 - 5 RBC/hpf   WBC, UA 11-20 0 - 5 WBC/hpf   Bacteria, UA RARE (A) NONE SEEN   Squamous Epithelial / LPF 21-50 0 - 5   Amorphous Crystal PRESENT    Urine-Other LESS THAN 10 mL OF URINE SUBMITTED     Comment: MICROSCOPIC EXAM PERFORMED ON UNCONCENTRATED URINE Performed at [facility], [place] [address], [place], [place] [address]   I-Stat CG4 Lactic Acid, ED     Status: Abnormal   Collection Time: 11/23/2017  4:44 PM  Result Value Ref Range   Lactic Acid, Venous 3.29 (HH) 0.5 - 1.9 mmol/L   Comment NOTIFIED PHYSICIAN   Glucose, capillary     Status: Abnormal   Collection Time: 11/17/2017  5:08 PM  Result Value Ref Range   Glucose-Capillary 208 (H) 70 - 99 mg/dL  HIV antibody (Routine Testing)     Status: None   Collection Time: 12/01/2017 10:32 PM  [id number]    Report Status PENDING   Comprehensive metabolic panel     Status: Abnormal   Collection Time: 11/11/2017  1:30 PM  Result Value Ref Range   Sodium 134 (L) 135 - 145 mmol/L   Potassium 4.8 3.5 - 5.1 mmol/L   Chloride 91 (L) 98 - 111 mmol/L   CO2 28 22 - 32 mmol/L   Glucose, Bld 248 (H) 70 - 99 mg/dL   BUN 40 (H) 6 - 20 mg/dL   Creatinine, Ser 7.45 (H) 0.44 - 1.00 mg/dL   Calcium 9.6 8.9 - 10.3 mg/dL   Total Protein 7.2 6.5 - 8.1 g/dL   Albumin 3.8 3.5 - 5.0 g/dL   AST 39 15 - 41 U/L   ALT 42 0 - 44 U/L   Alkaline Phosphatase 132 (H) 38 - 126 U/L   Total Bilirubin 0.8 0.3 - 1.2 mg/dL   GFR calc non Af Amer 5 (L) >60 mL/min   GFR calc Af Amer 6 (L) >60 mL/min    Comment: (NOTE) The eGFR has been calculated using the CKD EPI equation. This calculation has not been validated in all clinical situations. eGFR's persistently <60 mL/min signify possible Chronic Kidney Disease.    Anion gap 15 5 - 15    Comment: Performed at [facility] [address] [place], [place] [address]  Protime-INR     Status: None   Collection Time: 12/04/2017  1:30 PM  Result Value Ref Range   Prothrombin Time 13.4 11.4 - 15.2 seconds   INR 1.03     Comment: Performed at [facility] [address] [place], [place] [address]  Urinalysis, Routine w reflex microscopic     Status: Abnormal   Collection Time: 11/26/2017  2:20 PM  Result Value Ref Range   Color, Urine YELLOW YELLOW   APPearance CLOUDY (A) CLEAR   Specific Gravity, Urine 1.015 1.005 - 1.030   pH 8.5 (H) 5.0 - 8.0   Glucose, UA 250 (A) NEGATIVE mg/dL   Hgb urine dipstick LARGE (A) NEGATIVE   Bilirubin Urine NEGATIVE NEGATIVE   Ketones, ur NEGATIVE  NEGATIVE mg/dL   Protein, ur >300 (A) NEGATIVE mg/dL   Nitrite NEGATIVE NEGATIVE   Leukocytes, UA MODERATE (A) NEGATIVE    Comment: Performed at [facility] [address], [place], [place] [address]  Urinalysis, Microscopic (reflex)     Status: Abnormal   Collection Time: 11/21/2017  2:20 PM  Result Value Ref Range   RBC / HPF 6-10 0 - 5 RBC/hpf   WBC, UA 11-20 0 - 5 WBC/hpf   Bacteria, UA RARE (A) NONE SEEN   Squamous Epithelial / LPF 21-50 0 - 5   Amorphous Crystal PRESENT    Urine-Other LESS THAN 10 mL OF URINE SUBMITTED     Comment: MICROSCOPIC EXAM PERFORMED ON UNCONCENTRATED URINE Performed at [facility], [place] [address], [place], [place] [address]   I-Stat CG4 Lactic Acid, ED     Status: Abnormal   Collection Time: 11/23/2017  4:44 PM  Result Value Ref Range   Lactic Acid, Venous 3.29 (HH) 0.5 - 1.9 mmol/L   Comment NOTIFIED PHYSICIAN   Glucose, capillary     Status: Abnormal   Collection Time: 11/17/2017  5:08 PM  Result Value Ref Range   Glucose-Capillary 208 (H) 70 - 99 mg/dL  HIV antibody (Routine Testing)     Status: None   Collection Time: 12/01/2017 10:32 PM

## 2017-11-14 NOTE — Significant Event (Signed)
Rapid Response Event Note  I received a call from [facility] RN stating that patient needed to be moved to the ICU. When I arrived, GCS was 3, + snoring respirations, patient was still breathing on her on. I was informed that she had a brain hemorrhage that was new.  Immediately patient was placed on Zoll Defib [facility]. Patient's Left Pupil 4mm fixed, dilated, and unresponsive.   OF NOTE: RR RN was not called prior to this to assess this patient.   Start Time 0221 Arrival Time 0224 End Time 0330

## 2017-11-14 NOTE — Progress Notes (Signed)
Dr. Denese KillingsAgarwala made aware patient's Na+ 140 from 132 within 6 hrs. MD to round on patient. Continuing with current orders. Continuing to monitor.

## 2017-11-14 NOTE — Progress Notes (Signed)
Hospitalist called for updates on pt's status. CT of head stat ordered.

## 2017-11-14 NOTE — Progress Notes (Signed)
eLink Physician-Brief Progress Note Patient Name: Alexis ChengDora Bruschi DOB: 02-17-1961 MRN: [id number]   Date of Service  February 15, 2018  HPI/Events of Note  Sinus Tachycardia - Likely d/t vasodilation with rewarming.   eICU Interventions  Will order: 1. Bolus with 0.9 NaCl 1 liter IV over 1 hour now.      Intervention Category Major Interventions: Arrhythmia - evaluation and management  Sommer,Steven Eugene February 15, 2018, 11:07 PM

## 2017-11-14 NOTE — Progress Notes (Signed)
Dr. Denese KillingsAgarwala made aware patient remains on insulin gtt. Last CBG= 152 with insulin gtt @ 5.375ml/hr. Ok to discontinue insulin gtt and Q1hr CBGs per MD and initiate sliding scale insulin. Patient to continue on 3% @ 50cc/hr with follow-up Na+ due at 1600. Orders received and carried out. Continuing to monitor.

## 2017-11-15 ENCOUNTER — Inpatient Hospital Stay (HOSPITAL_COMMUNITY): Payer: Medicaid Other

## 2017-11-15 DIAGNOSIS — Z7189 Other specified counseling: Secondary | ICD-10-CM

## 2017-11-15 DIAGNOSIS — Z515 Encounter for palliative care: Secondary | ICD-10-CM

## 2017-11-15 DIAGNOSIS — G9341 Metabolic encephalopathy: Secondary | ICD-10-CM

## 2017-11-15 LAB — CBC WITH DIFFERENTIAL/PLATELET
Abs Immature Granulocytes: 0.1 10*3/uL (ref 0.0–0.1)
Basophils Absolute: 0.1 10*3/uL (ref 0.0–0.1)
Basophils Relative: 1 %
EOS ABS: 0 10*3/uL (ref 0.0–0.7)
Eosinophils Relative: 0 %
HEMATOCRIT: 39.7 % (ref 36.0–46.0)
Hemoglobin: 12.2 g/dL (ref 12.0–15.0)
Immature Granulocytes: 0 %
LYMPHS ABS: 1.7 10*3/uL (ref 0.7–4.0)
Lymphocytes Relative: 13 %
MCH: 29.6 pg (ref 26.0–34.0)
MCHC: 30.7 g/dL (ref 30.0–36.0)
MCV: 96.4 fL (ref 78.0–100.0)
MONO ABS: 1.8 10*3/uL — AB (ref 0.1–1.0)
Monocytes Relative: 14 %
Neutro Abs: 9 10*3/uL — ABNORMAL HIGH (ref 1.7–7.7)
Neutrophils Relative %: 72 %
Platelets: 84 10*3/uL — ABNORMAL LOW (ref 150–400)
RBC: 4.12 MIL/uL (ref 3.87–5.11)
RDW: 13.2 % (ref 11.5–15.5)
WBC: 12.5 10*3/uL — ABNORMAL HIGH (ref 4.0–10.5)

## 2017-11-15 LAB — GLUCOSE, CAPILLARY
GLUCOSE-CAPILLARY: 133 mg/dL — AB (ref 70–99)
GLUCOSE-CAPILLARY: 167 mg/dL — AB (ref 70–99)
GLUCOSE-CAPILLARY: 179 mg/dL — AB (ref 70–99)
GLUCOSE-CAPILLARY: 235 mg/dL — AB (ref 70–99)
GLUCOSE-CAPILLARY: 267 mg/dL — AB (ref 70–99)
Glucose-Capillary: 198 mg/dL — ABNORMAL HIGH (ref 70–99)

## 2017-11-15 LAB — RENAL FUNCTION PANEL
Albumin: 2.4 g/dL — ABNORMAL LOW (ref 3.5–5.0)
Anion gap: 13 (ref 5–15)
BUN: 57 mg/dL — AB (ref 6–20)
CO2: 19 mmol/L — ABNORMAL LOW (ref 22–32)
Calcium: 9 mg/dL (ref 8.9–10.3)
Chloride: 117 mmol/L — ABNORMAL HIGH (ref 98–111)
Creatinine, Ser: 6.7 mg/dL — ABNORMAL HIGH (ref 0.44–1.00)
GFR calc Af Amer: 7 mL/min — ABNORMAL LOW (ref >60)
GFR, EST NON AFRICAN AMERICAN: 6 mL/min — AB (ref >60)
GLUCOSE: 189 mg/dL — AB (ref 70–99)
PHOSPHORUS: 2.8 mg/dL (ref 2.5–4.6)
POTASSIUM: 3.3 mmol/L — AB (ref 3.5–5.1)
Sodium: 149 mmol/L — ABNORMAL HIGH (ref 135–145)

## 2017-11-15 LAB — POCT I-STAT 3, ART BLOOD GAS (G3+)
Acid-base deficit: 6 mmol/L — ABNORMAL HIGH (ref 0.0–2.0)
Bicarbonate: 17.3 mmol/L — ABNORMAL LOW (ref 20.0–28.0)
O2 SAT: 99 %
PCO2 ART: 27.8 mmHg — AB (ref 32.0–48.0)
PH ART: 7.402 (ref 7.350–7.450)
TCO2: 18 mmol/L — AB (ref 22–32)
pO2, Arterial: 129 mmHg — ABNORMAL HIGH (ref 83.0–108.0)

## 2017-11-15 LAB — SODIUM: SODIUM: 153 mmol/L — AB (ref 135–145)

## 2017-11-15 MED ORDER — MORPHINE 100MG IN NS 100ML (1MG/ML) PREMIX INFUSION
3.0000 mg/h | INTRAVENOUS | Status: DC
  Administered 2017-11-15: 3 mg/h via INTRAVENOUS
  Filled 2017-11-15: qty 100

## 2017-11-15 MED ORDER — SODIUM CHLORIDE 0.9 % IV BOLUS
1000.0000 mL | Freq: Once | INTRAVENOUS | Status: AC
  Administered 2017-11-15: 1000 mL via INTRAVENOUS

## 2017-11-15 MED ORDER — LORAZEPAM BOLUS VIA INFUSION: 0.5000 mg | Freq: Four times a day (QID) | INTRAVENOUS | Status: DC

## 2017-11-15 MED ORDER — LORAZEPAM 2 MG/ML IJ SOLN
0.5000 mg | Freq: Four times a day (QID) | INTRAMUSCULAR | Status: DC
  Administered 2017-11-15: 0.5 mg via INTRAVENOUS
  Filled 2017-11-15: qty 1

## 2017-11-16 LAB — CULTURE, RESPIRATORY W GRAM STAIN: Culture: NORMAL

## 2017-11-16 LAB — CULTURE, RESPIRATORY

## 2017-11-17 LAB — CULTURE, BLOOD (ROUTINE X 2)
CULTURE: NO GROWTH
Culture: NO GROWTH

## 2017-11-17 MED FILL — Medication: Qty: 1 | Status: AC

## 2017-12-05 NOTE — Progress Notes (Signed)
Spoke to Dr. Maurice Smallstergard regarding the expired 3% NaCl order. He gave verbal order to stop it. RN will continue to momitor.

## 2017-12-05 NOTE — Progress Notes (Addendum)
Alexis Sims  WUJ:[id number] DOB: [birth year] DOA: 11/26/2017 PCP: Fleet ContrasAvbuere, Edwin, MD    Brief Narrative:  The patient is a 57 year old chronically ill woman who was admitted with altered mental status.  This was originally ascribed to hypoglycemia and a possible infection.  Unfortunately her mental status failed to improve and she underwent an MRI which revealed a massive intraparenchymal hemorrhage involving both lobes intraventricular extension and subfalcine herniation.  Neurosurgery has declined any intervention.  Palliative care has been involved in the family is realistic to expect the patient to pass away. Significant past medical history includes hypertension, diabetes, seizure disorder, prior CVA, ESRD on hemodialysis.  Subjective: Low-dose fentanyl was discontinued completely this morning but the patient remains entirely unresponsive.  She was seen by donor services earlier today as a potential liver donor however she actually passed a spontaneous breathing trial and is therefore neither a candidate for declaration of brain death and heart beating donation or non-heart beating donation.  Family has not yet arrived to discuss treatment.  She has been tachycardic this morning and received a liter of normal saline.  Assessment & Plan:  Altered mental status.  MRI has shown bilateral frontal and temporal hemorrhages with a 7 mm right to left shift.  Family has opted for no intervention surgically as her surgery has explained to them that this will not return the patient to a functional state.  I will not be further investigating the provocation for her multiple bleeds as family has indicated that they do not wish to have aggressive care if the patient is not going to return to a functional status.  I am anticipating meeting with him later today.  Acute respiratory failure secondary to inability to protect airway.  He does breathe above the set ventilator rate.  Again I am not going to  perform any interventions pending arrival of family in anticipation of converting her to a comfort measures only patient.  End-stage renal disease on hemodialysis.  Further dialysis will be deferred based on goals of care.  DVT prophylaxis: Chemical prophylaxis is contraindicated with her intracranial hemorrhage  GI prophylaxis:  famotidine Diet: NPO Mobility: not appropriate Code Status: DNR.  Consultants:  Palliative care  Objective: Blood pressure 95/72, pulse (!) 134, temperature 98.5 F (36.9 C), temperature source Axillary, resp. rate (!) 26, height 5\' 2"  (1.575 m), weight 39.8 kg, SpO2 100 %.    Vent Mode: PRVC FiO2 (%):  [30 %] 30 % Set Rate:  [15 bmp] 15 bmp Vt Set:  [400 mL] 400 mL PEEP:  [5 cmH20] 5 cmH20 Plateau Pressure:  [11 cmH20-19 cmH20] 19 cmH20   Intake/Output Summary (Last 24 hours) at Aug 30, 2017 1151 Last data filed at Aug 30, 2017 0800 Gross per 24 hour  Intake 2670.9 ml  Output 0 ml  Net 2670.9 ml   Filed Weights   11/13/17 0112 11/25/2017 0246 03-May-2017 0500  Weight: 40.5 kg 37.8 kg 39.8 kg    Examination: General: Thin frail appearing female who is orally intubated and mechanically ventilated  ENT: OGT and ETT in place. Lungs: She is breathing above the set ventilator rate.  There is symmetric air movement, no wheezes, no significant rhonchi or rales.   Cardiovascular: The PMI is hyperdynamic.  S1 and S2 are regular without murmur rub or gallop.  He does not have overt JVD. Abdomen: Soft Extremities: No visible deformities. Neuro: There is no response to voice or loud noise.  He does move the left lower extremity to noxious  stimuli.  The right pupil is postsurgical.  The left pupil is 3 mm and reactive.  The face is symmetric.  She has no corneals, she has a brisk cough. GU: Foley catheter in place.  CBC: Recent Labs  Lab 2017-08-19 1237 11/08/2017 0334 11/28/2017 0419  WBC 6.6 17.0* 12.5*  NEUTROABS 4.9 14.0* 9.0*  HGB 12.3 12.4 12.2  HCT 37.7  38.4 39.7  MCV 93.1 91.4 96.4  PLT 107* 147* 84*   Basic Metabolic Panel: Recent Labs  Lab 2017-08-19 1330 11/13/17 0909 12/03/2017 0334  11/18/2017 0952 11/22/2017 1600 11/20/2017 2209 11/10/2017 0419 11/11/2017 0932  NA 134* 137 134*   < > 140 145 148* 149* 153*  K 4.8 3.6 4.0  --   --   --   --  3.3*  --   CL 91* 93* 88*  --   --   --   --  117*  --   CO2 28 25 21*  --   --   --   --  19*  --   GLUCOSE 248* 274* 481*  --   --   --   --  189*  --   BUN 40* 23* 48*  --   --   --   --  57*  --   CREATININE 7.45* 4.06* 6.53*  --   --   --   --  6.70*  --   CALCIUM 9.6 9.5 9.5  --   --   --   --  9.0  --   MG  --   --  2.5*  --   --   --   --   --   --   PHOS  --  5.0* 3.9  --   --   --   --  2.8  --    < > = values in this interval not displayed.   GFR: Estimated Creatinine Clearance: 5.8 mL/min (A) (by C-G formula based on SCr of 6.7 mg/dL (H)). Recent Labs  Lab 2017-08-19 1237 2017-08-19 1248 2017-08-19 1644 11/24/2017 0334 11/08/2017 0419  PROCALCITON  --   --   --  2.54  --   WBC 6.6  --   --  17.0* 12.5*  LATICACIDVEN  --  1.80 3.29* 1.4  --    Liver Function Tests: Recent Labs  Lab 2017-08-19 1330 11/13/17 0909 11/25/2017 0334 11/24/2017 0419  AST 39 32 33  --   ALT 42 34 31  --   ALKPHOS 132* 105 103  --   BILITOT 0.8 1.4* 1.3*  --   PROT 7.2 7.1 7.3  --   ALBUMIN 3.8 3.4* 3.3* 2.4*   No results for input(s): LIPASE, AMYLASE in the last 168 hours. No results for input(s): AMMONIA in the last 168 hours.  Coagulation Profile: Recent Labs  Lab 2017-08-19 1330 11/21/2017 0334  INR 1.03 1.05    Cardiac Enzymes: Recent Labs  Lab 11/23/2017 0334 11/10/2017 0952 11/08/2017 1600  TROPONINI 0.14* 0.11* 0.09*    HbA1C: Hgb A1c MFr Bld  Date/Time Value Ref Range Status  05/12/2017 05:42 PM 10.4 (H) 4.8 - 5.6 % Final    Comment:    (NOTE) Pre diabetes:          5.7%-6.4% Diabetes:              >6.4% Glycemic control for   <7.0% adults with diabetes     CBG: Recent Labs  Lab  12/02/2017 1747 11/10/2017  1912 12/08/2017 0024 2017-12-08 0310 Dec 08, 2017 0820  GLUCAP 133* 147* 198* 167* 179*       Penny Pia, MD Critical Care   I was able to speak with family this afternoon including her sister who holds power of attorney and all are convinced that she would not wish to be supported in his situation where she will not return to a functional status.  They have asked that I withdraw all supportive measures and provide comfort only.  They are aware that she does breathe on her own and that nothing is happened to her heart and lungs and that it may be some time before she expires if she tolerates extubation without obstructing her airway.  Appropriate comfort measures have been ordered.   Greater than 35 minutes was spent in the care of this patient today who has suffered from a life-threatening neurological insult.

## 2017-12-05 NOTE — Progress Notes (Signed)
eLink Physician-Brief Progress Note Patient Name: Alexis ChengDora Sims DOB: 1960/12/29 MRN: [id number]   Date of Service  05-Oct-2017  HPI/Events of Note  Sinus Tachycardia - HR now = 131. HR did improve with bolus given earlier. Suspect sinus tachycardia related to warming and vasodilation.   eICU Interventions  Will order: 1. Bolus with 0.9 NaCl 1 liter IV over 1 hour now.      Intervention Category Major Interventions: Arrhythmia - evaluation and management  Sommer,Steven Eugene 05-Oct-2017, 2:01 AM

## 2017-12-05 NOTE — Death Summary Note (Signed)
Date of admission 11/30/2017 Date of death 11/09/2017  Principal diagnoses:  Intracranial hemorrhage with ventricular extension and subfalcine herniation. History of CKD on dialysis History of type 2 diabetes History of prior CVA History of hypertension History of seizures  Hospital course:  Alexis Sims was admitted with altered mental status initially attributed to hypoglycemia and a possible UTI.  When her mental status fail to improve an MRI was obtained of the brain which showed massive intraparenchymal hemorrhage involving frontal and parietal lobes with subfalcine herniation.  Neurosurgery was consulted and felt that there was no neurosurgical intervention that would restore the patient to a functional state.  Family members were consulted and it was their feeling that life support measures would not be appropriate if she was not to return to a functional neurological status.  The patient was seen by the organ procurement service and they felt that as she tolerated a spontaneous breathing trial she was not a potential non-heart beating donor candidate.  She was not brain dead on my initial examination but she was extremely neurologically compromised.  I shared these findings with family including the sister who holds power of attorney.  I asked him to observe Alexis Sims with the understanding that she had been off sedation all day and I asked if she would wish to be preserved in the state that they were seeing.  All concurred that to support her with feeding tube and endotracheal intubation would not be consistent with her wishes and that their request comfort measures only were provided.  After we switched her to CPAP, the patient became bradycardic and subsequently succumbed at 17:28

## 2017-12-05 NOTE — Progress Notes (Signed)
Daily Progress Note   Patient Name: Marc Sivertsen       Date: November 16, 2017 DOB: Apr 17, 1960  Age: 57 y.o. MRN#: [id number] Attending Physician: Reyne Dumas, MD Primary Care Physician: Nolene Ebbs, MD Admit Date: 11/18/2017  Reason for Consultation/Follow-up: Establishing goals of care and Terminal Care  Subjective: Patient intubated. No s/s of pain or distress. Motor response with deep pressure to left nailbed, otherwise unresponsive. No family at bedside.   GOC:   1120: Spoke with sister, [name] via telephone. Explained that Jorie is breathing over the ventilator but explained continued unresponsiveness during neuro assessments. Again discussed poor prognosis from intraventricular hemorrhage and brainstem compression. [name] understands. She tells me she is not in [place] right now but she will notify the nurse when she arrives at the hospital. Explained that we will further discuss goals and transition to comfort with Dr. Pearline Cables when she arrives.   Addendum 1540: Still no family at bedside.   Length of Stay: 3  Current Medications: Scheduled Meds:  . amLODipine  5 mg Oral Daily  . chlorhexidine gluconate (MEDLINE KIT)  15 mL Mouth Rinse BID  . Chlorhexidine Gluconate Cloth  6 each Topical Q0600  . doxercalciferol  2 mcg Intravenous Q M,W,F-HD  . famotidine  20 mg Per Tube Daily  . ferric citrate  840 mg Oral TID WC  . insulin aspart  0-9 Units Subcutaneous Q4H  . levETIRAcetam  1,000 mg Per Tube Daily  . mouth rinse  15 mL Mouth Rinse 10 times per day    Continuous Infusions: . sodium chloride    . sodium chloride    . sodium chloride    . fentaNYL infusion INTRAVENOUS Stopped (11/11/2017 1351)    PRN Meds: sodium chloride, sodium chloride, sodium chloride, acetaminophen  (TYLENOL) oral liquid 160 mg/5 mL **OR** acetaminophen, docusate, fentaNYL (SUBLIMAZE) injection, fentaNYL (SUBLIMAZE) injection, heparin, heparin, hydrALAZINE, lidocaine (PF), lidocaine-prilocaine, midazolam, midazolam, ondansetron (ZOFRAN) IV, pentafluoroprop-tetrafluoroeth  Physical Exam  Constitutional: She appears ill. She is intubated.  Eyes: Right pupil is not reactive. Left pupil is not reactive. Pupils are unequal.  Surgical right eye. Left eye 71m, non-reactive to light.  Cardiovascular: Tachycardia present.  Pulmonary/Chest: No accessory muscle usage. No tachypnea. She is intubated. No respiratory distress.  Spontaneous breathing on vent wean mode.  Abdominal: There is no tenderness.  Neurological: She is unresponsive. GCS eye subscore is 1. GCS verbal subscore is 1. GCS motor subscore is 1.  Motor response to deep pressure to left nailbed, otherwise unresponsive to stimuli.   Skin: Skin is warm and dry.  Nursing note and vitals reviewed.          Vital Signs: BP (!) 123/97   Pulse (!) 134   Temp 98.5 F (36.9 C) (Axillary)   Resp (!) 25   Ht 5' 2"  (1.575 m)   Wt 39.8 kg   SpO2 100%   BMI 16.05 kg/m  SpO2: SpO2: 100 % O2 Device: O2 Device: Ventilator O2 Flow Rate:    Intake/output summary:   Intake/Output Summary (Last 24 hours) at 11-16-17 1010 Last data filed at 11/16/2017 0600 Gross per 24 hour  Intake 2795.29 ml  Output 0 ml  Net 2795.29 ml   LBM: Last BM Date: 12/02/2017 Baseline Weight: Weight: 37 kg Most recent weight: Weight: 39.8 kg       Palliative Assessment/Data: PPS 10%   Flowsheet Rows     Most Recent Value  Intake Tab  Referral Department  Critical care  Unit at Time of Referral  ICU  Palliative Care Primary Diagnosis  Neurology  Palliative Care Type  New Palliative care  Reason for referral  Clarify Goals of Care  Date first seen by Palliative Care  11/29/2017  Clinical Assessment  Palliative Performance Scale Score  10%  Psychosocial &  Spiritual Assessment  Palliative Care Outcomes  Patient/Family meeting held?  Yes  Who was at the meeting?  sisters, brother  Palliative Care Outcomes  Clarified goals of care, Provided end of life care assistance, ACP counseling assistance, Provided psychosocial or spiritual support      Patient Active Problem List   Diagnosis Date Noted  . Intracranial hemorrhage ([place]) 11/24/2017  . Palliative care by specialist   . Goals of care, counseling/discussion   . Acute respiratory failure with hypoxia ([place])   . Fever 11/05/2017  . UTI (urinary tract infection) 12/02/2017  . Acute metabolic encephalopathy 44/92/0100  . Metabolic encephalopathy 71/21/9758  . Status epilepticus ([place]) 05/12/2017  . End stage renal disease on dialysis ([place]) 05/12/2017  . History of CVA (cerebrovascular accident) 05/12/2017  . Acute hyperglycemia 05/12/2017  . HTN (hypertension) 05/12/2017  . Gait disturbance, post-stroke 05/12/2017  . Type 2 diabetes mellitus with hypoglycemia without coma ([place]) 05/12/2017  . Transaminitis 05/12/2017  . Thrombocytopenia ([place]) 05/12/2017  . Sinus tachycardia 05/12/2017  . Hyperkalemia 06/01/2015    Palliative Care Assessment & Plan   Patient Profile: 57 y.o. female  with past medical history of ESRD on HD, CVA, seizures, DM, hepatis C, and traumatic brain injury admitted on 11/25/2017 with altered mental status. On admission, positive for UTI and IV rocephin initiated. On 8/11 early AM,rapid response team called for worsening mental status. MRI demonstrated large multifocal intraparenchymal hemorrhage with significant R>L shift, IVH, SAH, and ischemia of frontal lobes. Patient unresponsive with non-reactive pupils, negative doll's eyes, and no motor response. Neurosurgery spoke with sister/caregiver about diagnoses/poor prognosis. Caregiver declined craniectomy and hematoma evacuation. Palliative medicine consultation for goals of care/terminal care.   Assessment: Acute  encephalopathy Intraventricular hemorrhage with brainstem compression Acute respiratory failure on ventilator  ESRD on hemodialysis  Recommendations/Plan:  Continue current level of care.   Limited code. In the event of cardiac arrest, NO resuscitation or defibrillation. Allow comfort and dignity at EOL.   Spoke with sister, [name] via telephone who planned to  follow-up with me this afternoon. Addendum 1540: Family not at bedside this afternoon.   Likely shift to comfort and compassionate extubation once family arrives. Sister was trying to get in contact with two other siblings in [place].    Code Status: Limited  Code Status Orders  (From admission, onward)         Start     Ordered   11/28/2017 0355  Limited resuscitation (code)  Continuous    Question Answer Comment  In the event of cardiac or respiratory ARREST: Initiate Code Blue, Call Rapid Response Yes   In the event of cardiac or respiratory ARREST: Perform CPR No   In the event of cardiac or respiratory ARREST: Perform Intubation/Mechanical Ventilation Yes   In the event of cardiac or respiratory ARREST: Use NIPPV/BiPAp only if indicated Yes   In the event of cardiac or respiratory ARREST: Administer ACLS medications if indicated Yes   In the event of cardiac or respiratory ARREST: Perform Defibrillation or Cardioversion if indicated No   Comments no CPR or Defibrillation per Neurosurgeon's conversation with family      11/06/2017 0355        Code Status History    Date Active Date Inactive Code Status Order ID Comments User Context   11/23/2017 0309 12/02/2017 0355 Full Code [id number]  Reyne Dumas, MD Inpatient   11/07/2017 1624 11/08/2017 0309 Full Code [id number]  Lady Deutscher, MD ED   05/14/2017 0940 05/18/2017 1958 DNR [id number]  Donita Brooks, NP Inpatient   05/12/2017 1649 05/14/2017 0940 Full Code [id number]  Samella Parr, NP ED       Prognosis:   Hours - Days once transitioned to comfort and  compassionate extubation. Very poor prognosis.   Discharge Planning:  To Be Determined Likely hospital death when care is transitioned to comfort.   Care plan was discussed with Dr. Pearline Cables, RN, sister [name])  Thank you for allowing the Palliative Medicine Team to assist in the care of this patient.   Time In: 1100- 1530- Time Out: 1120 1540 Total Time 53mn Prolonged Time Billed  no      Greater than 50%  of this time was spent counseling and coordinating care related to the above assessment and plan.  MIhor Dow FNP-C Palliative Medicine Team  Phone: [phone]: [phone] Please contact Palliative Medicine Team phone at [phone] questions and concerns.

## 2017-12-05 NOTE — Progress Notes (Signed)
Patient passed away on the vent. After two nurses confirmed that her heart had stopped RT disconnected patient from the vent. ETT is still in place.

## 2017-12-05 NOTE — Progress Notes (Signed)
  NEUROSURGERY PROGRESS NOTE   Tachy overnight, given NS bolus.   EXAM:  BP (!) 123/97   Pulse (!) 134   Temp 98.1 F (36.7 C) (Axillary)   Resp (!) 25   Ht 5\' 2"  (1.575 m)   Wt 39.8 kg   SpO2 100%   BMI 16.05 kg/m   Intubated, not on any sedatives. Breathing over vent Left pupil 4mm, non-reactive Right pupil post-surgical, non-reactive (-) doll's eyes No motor responses  IMPRESSION:  57 y.o. female with ICH possibly traumatic with underlying CKD V and dismal prognosis.   PLAN: - Family meeting today to discuss goals of care, likely transition to palliative care.

## 2017-12-05 DEATH — deceased

## 2019-03-09 IMAGING — DX DG CHEST 1V PORT
1 series · 1 of 1 positions shown · non-contrast
Comparison: 05/31/2015

CLINICAL DATA: Seizure today with aspiration pneumonia.

EXAM:
PORTABLE CHEST 1 VIEW

[chest ap]
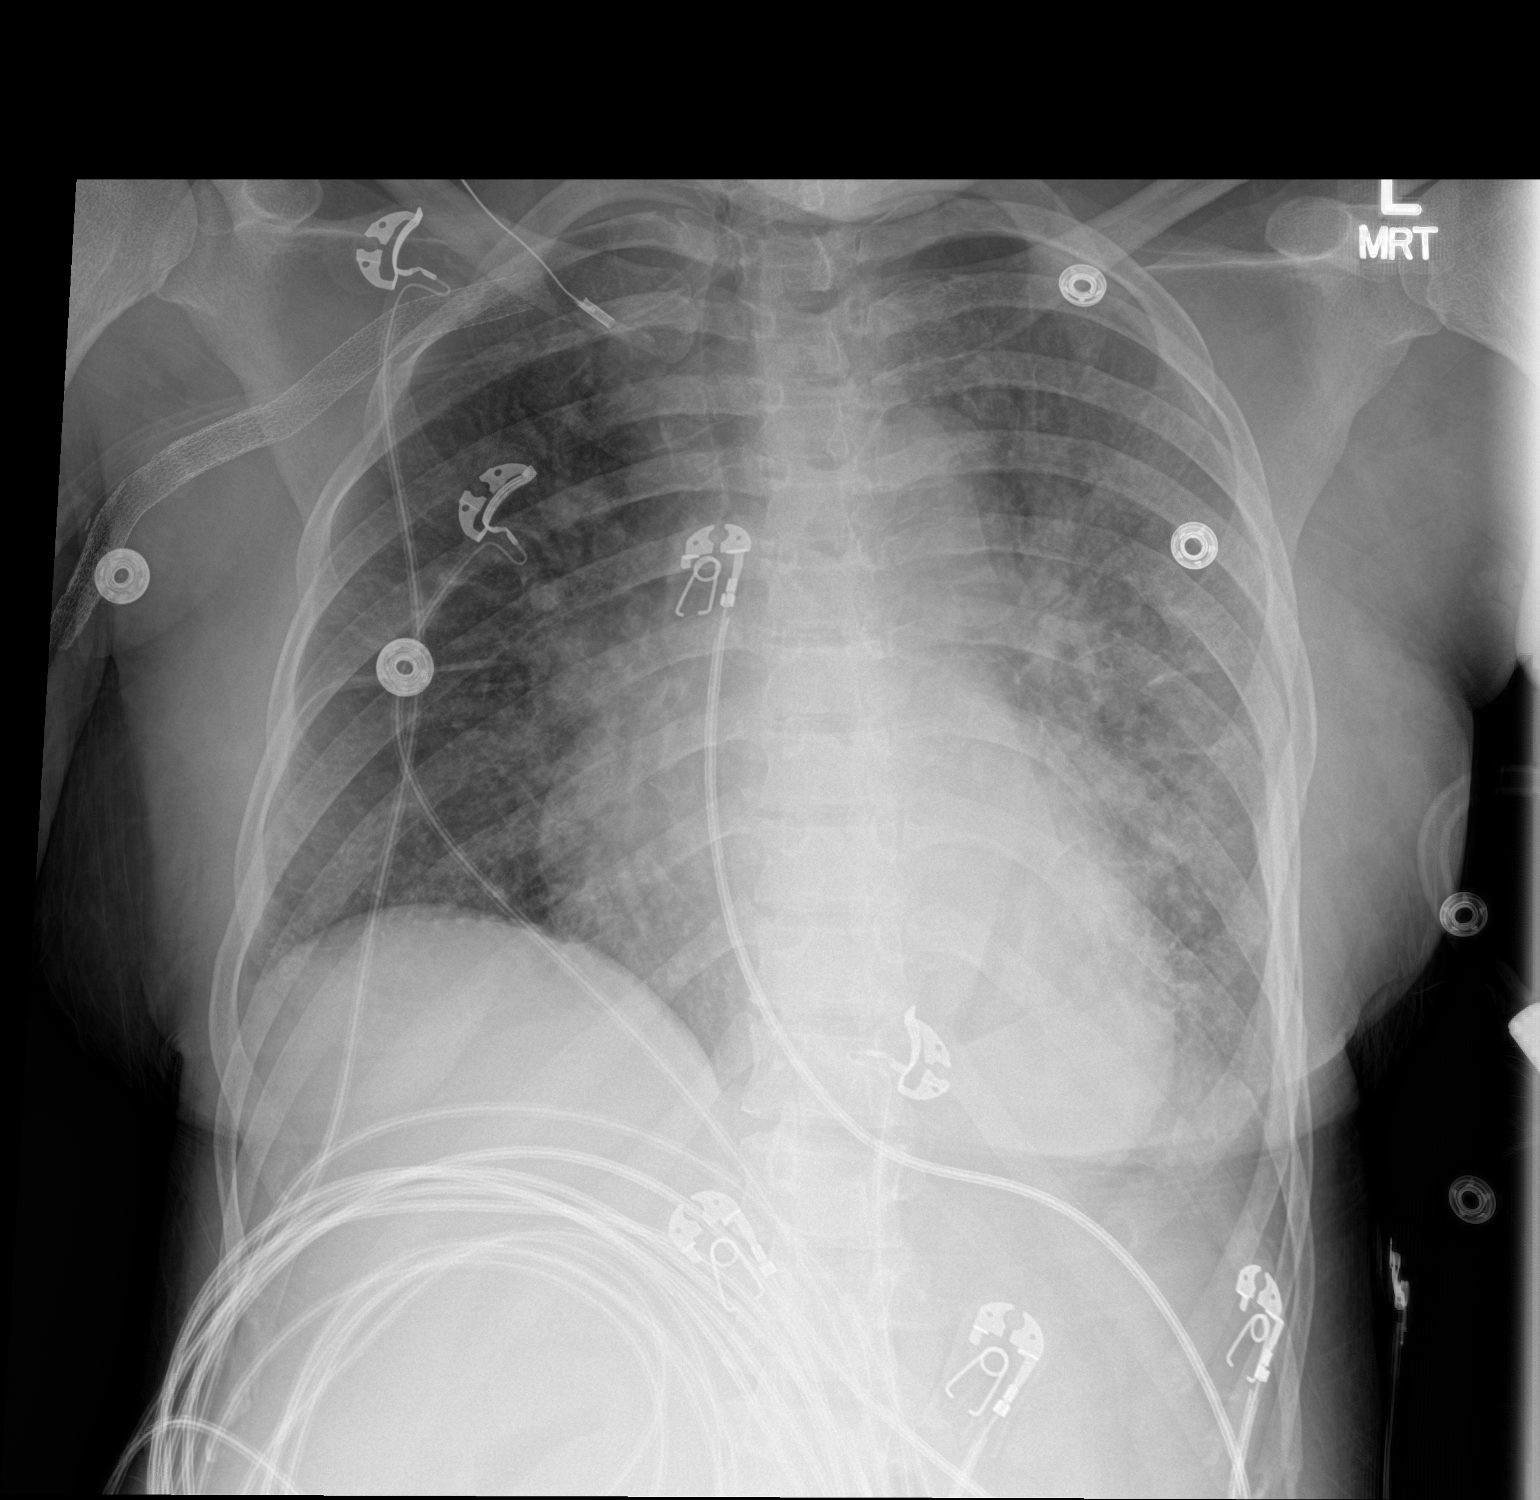

[1 of 1 positions shown; findings below may reference images not displayed]

FINDINGS: Mild cardiomegaly. Mediastinal shadows are normal. There chronic
lung markings, but interstitial and alveolar density appears
accentuated. This is more consistent with acute edema. Density is
slightly more focally prominent in the left lower lobe where there
could be some aspiration. No effusions. No acute bone finding.
Multiple vascular stents remain evident on the right.
IMPRESSION: Interstitial and alveolar density consistent with edema superimposed
upon chronic lung disease. More focal density in the left lower lobe
could be aspiration.

## 2019-03-11 IMAGING — DX DG CHEST 1V PORT
1 series · 1 of 1 positions shown · non-contrast
Comparison: Two days ago

CLINICAL DATA: Acute encephalopathy

EXAM:
PORTABLE CHEST 1 VIEW

[chest ap]
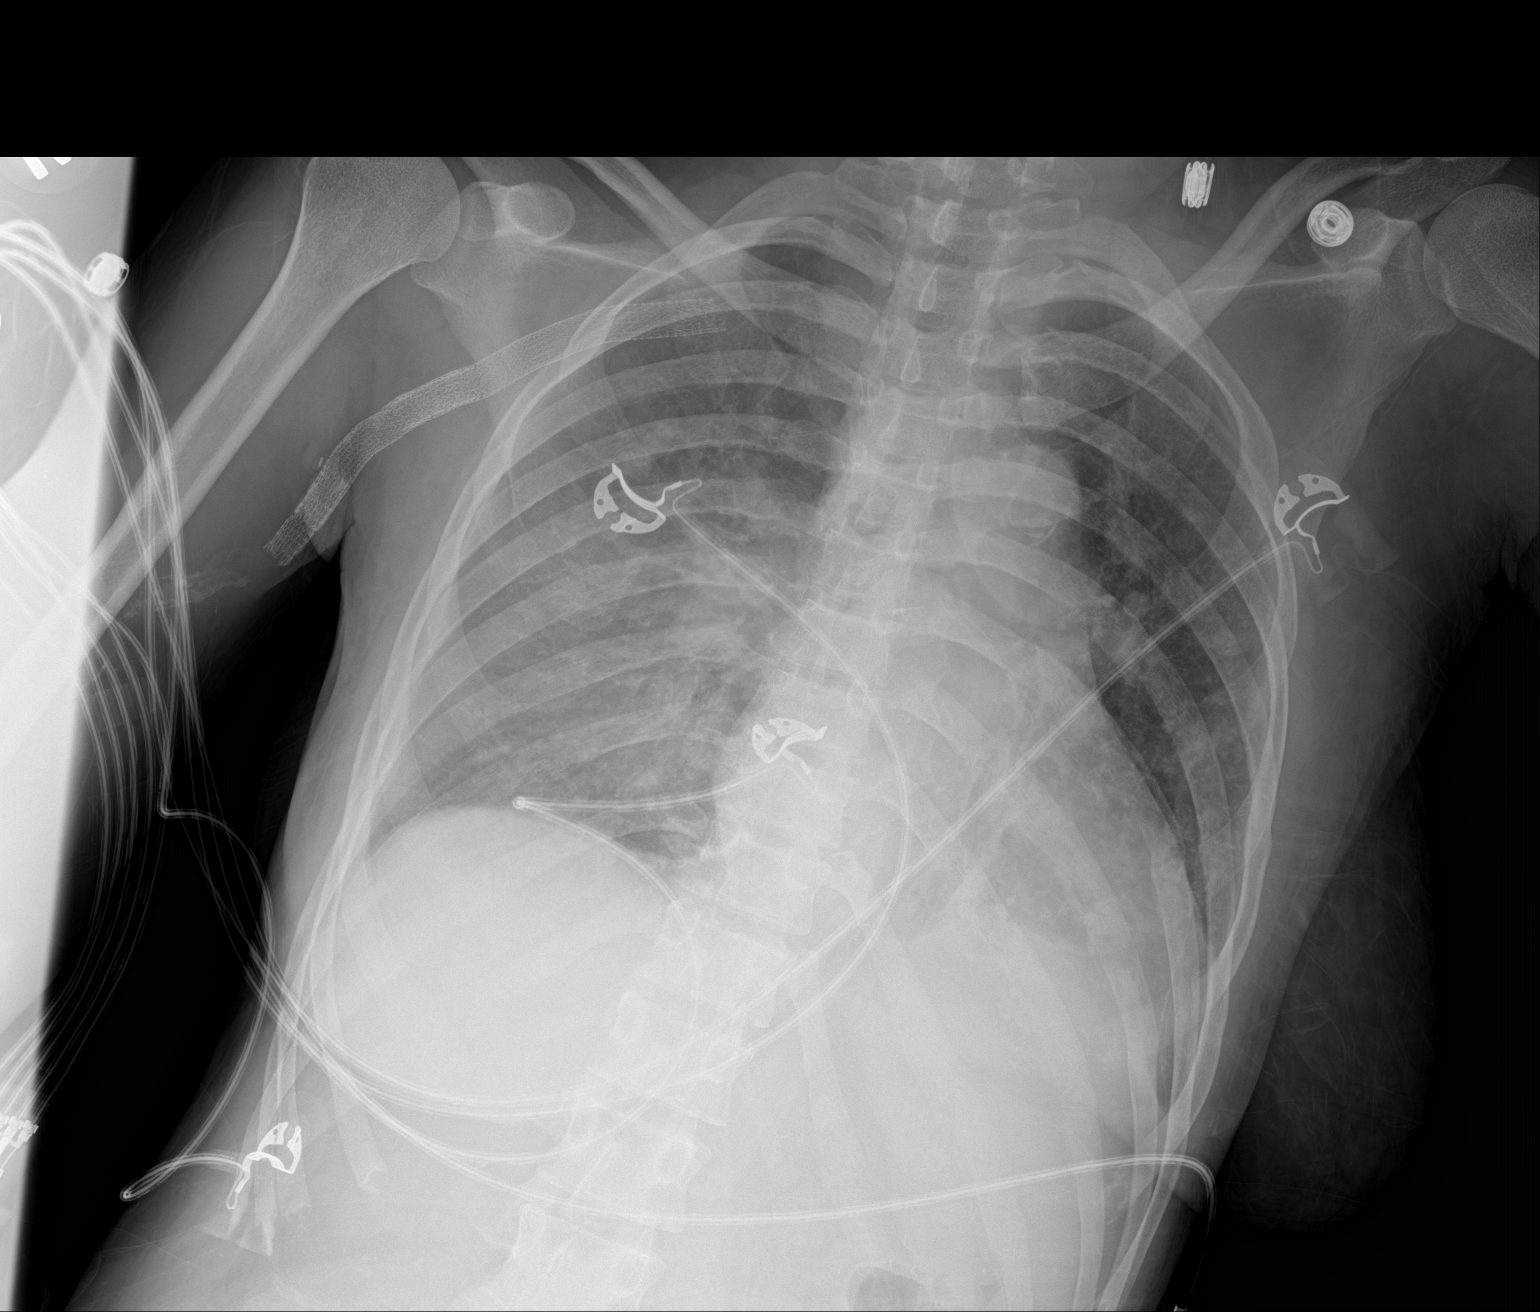

[1 of 1 positions shown; findings below may reference images not displayed]

FINDINGS: Right more than left hazy lung opacity. There is a streaky
retrocardiac density. Chronic cardiomegaly. No Kerley lines or
visible effusion. No pneumothorax. Extensive venous stenting in the
right upper extremity.
IMPRESSION: 1. Hazy opacity of the right more than left chest which could be
edema or pneumonitis. There is a retrocardiac streaky opacity
favoring atelectasis.
2. Chronic cardiomegaly.

## 2019-09-12 IMAGING — DX DG CHEST 1V PORT
1 series · 1 of 1 positions shown · non-contrast
Comparison: 11/14/2017

CLINICAL DATA: Respiratory failure

EXAM:
PORTABLE CHEST 1 VIEW

[chest]
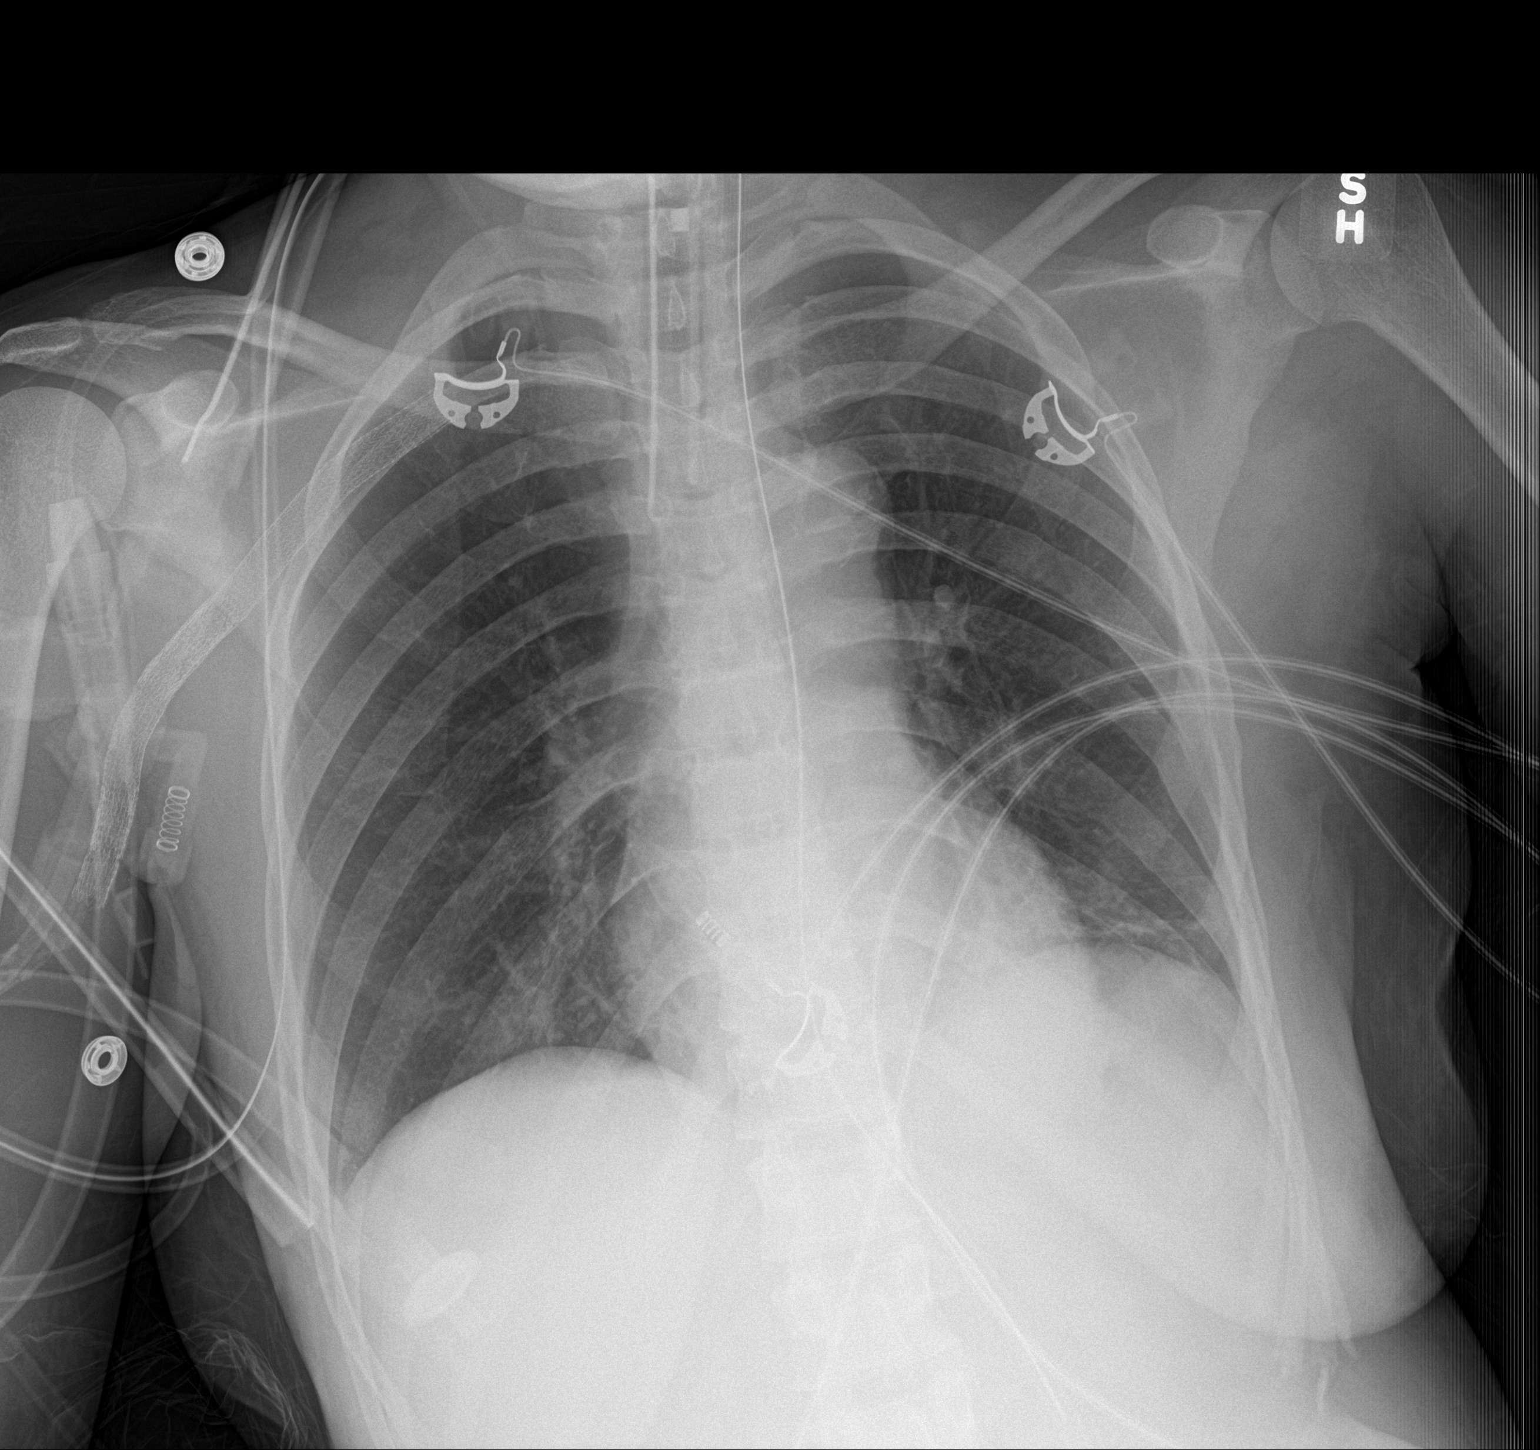

[1 of 1 positions shown; findings below may reference images not displayed]

FINDINGS: An endotracheal tube has been retracted with tip 2.3 cm above the
carina.

An NG tube in the stomach again noted.

Re-expansion of the LEFT lung noted with minimal LEFT basilar
atelectasis.

There is no evidence of pneumothorax or pleural effusion.

RIGHT subclavian/axillary stent again noted.
IMPRESSION: Retraction of endotracheal tube with tip 2.3 cm above the carina.
Re-expansion of the LEFT lung with minimal LEFT basilar atelectasis.
